# Patient Record
Sex: Female | Born: 1950 | Race: White | Hispanic: No | Marital: Married | Smoking: Never smoker
Health system: Southern US, Community
[De-identification: ages and names within clinical notes are randomized; demographics above are authoritative.]

## PROBLEM LIST (undated history)

## (undated) DIAGNOSIS — K7460 Unspecified cirrhosis of liver: Secondary | ICD-10-CM

## (undated) DIAGNOSIS — K219 Gastro-esophageal reflux disease without esophagitis: Secondary | ICD-10-CM

## (undated) DIAGNOSIS — B1920 Unspecified viral hepatitis C without hepatic coma: Secondary | ICD-10-CM

## (undated) DIAGNOSIS — F319 Bipolar disorder, unspecified: Secondary | ICD-10-CM

## (undated) DIAGNOSIS — K2270 Barrett's esophagus without dysplasia: Secondary | ICD-10-CM

## (undated) HISTORY — PX: OOPHORECTOMY: SHX86

## (undated) HISTORY — PX: ABDOMINAL HYSTERECTOMY: SHX81

## (undated) HISTORY — PX: CARPAL TUNNEL RELEASE: SHX101

## (undated) HISTORY — PX: ANKLE SURGERY: SHX546

---

## 1998-03-31 ENCOUNTER — Other Ambulatory Visit: Admission: RE | Admit: 1998-03-31 | Discharge: 1998-03-31 | Payer: Self-pay | Admitting: Orthopedic Surgery

## 1998-09-01 ENCOUNTER — Other Ambulatory Visit: Admission: RE | Admit: 1998-09-01 | Discharge: 1998-09-01 | Payer: Self-pay | Admitting: *Deleted

## 1999-10-27 ENCOUNTER — Other Ambulatory Visit: Admission: RE | Admit: 1999-10-27 | Discharge: 1999-10-27 | Payer: Self-pay | Admitting: *Deleted

## 2001-09-20 ENCOUNTER — Other Ambulatory Visit: Admission: RE | Admit: 2001-09-20 | Discharge: 2001-09-20 | Payer: Self-pay | Admitting: *Deleted

## 2002-11-26 ENCOUNTER — Other Ambulatory Visit: Admission: RE | Admit: 2002-11-26 | Discharge: 2002-11-26 | Payer: Self-pay | Admitting: *Deleted

## 2009-05-31 ENCOUNTER — Emergency Department: Payer: Self-pay | Admitting: Emergency Medicine

## 2011-03-16 ENCOUNTER — Emergency Department: Payer: Self-pay | Admitting: Internal Medicine

## 2014-12-19 ENCOUNTER — Inpatient Hospital Stay
Admission: EM | Admit: 2014-12-19 | Discharge: 2014-12-23 | DRG: 897 | Disposition: A | Discharge status: Home / Self Care | Payer: BLUE CROSS/BLUE SHIELD | Attending: Internal Medicine | Admitting: Internal Medicine

## 2014-12-19 ENCOUNTER — Emergency Department: Payer: BLUE CROSS/BLUE SHIELD

## 2014-12-19 DIAGNOSIS — B1920 Unspecified viral hepatitis C without hepatic coma: Secondary | ICD-10-CM | POA: Diagnosis present

## 2014-12-19 DIAGNOSIS — K921 Melena: Secondary | ICD-10-CM | POA: Diagnosis present

## 2014-12-19 DIAGNOSIS — K219 Gastro-esophageal reflux disease without esophagitis: Secondary | ICD-10-CM | POA: Diagnosis present

## 2014-12-19 DIAGNOSIS — K7290 Hepatic failure, unspecified without coma: Secondary | ICD-10-CM | POA: Diagnosis not present

## 2014-12-19 DIAGNOSIS — K7460 Unspecified cirrhosis of liver: Secondary | ICD-10-CM | POA: Diagnosis present

## 2014-12-19 DIAGNOSIS — W19XXXA Unspecified fall, initial encounter: Secondary | ICD-10-CM | POA: Diagnosis present

## 2014-12-19 DIAGNOSIS — E876 Hypokalemia: Secondary | ICD-10-CM | POA: Diagnosis present

## 2014-12-19 DIAGNOSIS — F319 Bipolar disorder, unspecified: Secondary | ICD-10-CM | POA: Diagnosis present

## 2014-12-19 DIAGNOSIS — F10239 Alcohol dependence with withdrawal, unspecified: Secondary | ICD-10-CM | POA: Diagnosis present

## 2014-12-19 DIAGNOSIS — R4781 Slurred speech: Secondary | ICD-10-CM | POA: Diagnosis not present

## 2014-12-19 DIAGNOSIS — Z79891 Long term (current) use of opiate analgesic: Secondary | ICD-10-CM

## 2014-12-19 DIAGNOSIS — K2970 Gastritis, unspecified, without bleeding: Secondary | ICD-10-CM | POA: Diagnosis present

## 2014-12-19 DIAGNOSIS — Z66 Do not resuscitate: Secondary | ICD-10-CM | POA: Diagnosis present

## 2014-12-19 DIAGNOSIS — I8500 Esophageal varices without bleeding: Secondary | ICD-10-CM | POA: Diagnosis present

## 2014-12-19 DIAGNOSIS — K922 Gastrointestinal hemorrhage, unspecified: Secondary | ICD-10-CM | POA: Diagnosis present

## 2014-12-19 DIAGNOSIS — D689 Coagulation defect, unspecified: Secondary | ICD-10-CM | POA: Diagnosis present

## 2014-12-19 DIAGNOSIS — F10230 Alcohol dependence with withdrawal, uncomplicated: Principal | ICD-10-CM | POA: Diagnosis present

## 2014-12-19 DIAGNOSIS — X58XXXA Exposure to other specified factors, initial encounter: Secondary | ICD-10-CM | POA: Diagnosis present

## 2014-12-19 DIAGNOSIS — Z79899 Other long term (current) drug therapy: Secondary | ICD-10-CM

## 2014-12-19 DIAGNOSIS — K2270 Barrett's esophagus without dysplasia: Secondary | ICD-10-CM | POA: Diagnosis present

## 2014-12-19 DIAGNOSIS — R000 Tachycardia, unspecified: Secondary | ICD-10-CM | POA: Diagnosis present

## 2014-12-19 DIAGNOSIS — F10939 Alcohol use, unspecified with withdrawal, unspecified: Secondary | ICD-10-CM | POA: Diagnosis present

## 2014-12-19 DIAGNOSIS — D6959 Other secondary thrombocytopenia: Secondary | ICD-10-CM | POA: Diagnosis present

## 2014-12-19 DIAGNOSIS — R100 Acute abdomen: Secondary | ICD-10-CM | POA: Diagnosis not present

## 2014-12-19 DIAGNOSIS — R109 Unspecified abdominal pain: Secondary | ICD-10-CM

## 2014-12-19 DIAGNOSIS — F10930 Alcohol use, unspecified with withdrawal, uncomplicated: Secondary | ICD-10-CM

## 2014-12-19 HISTORY — DX: Gastro-esophageal reflux disease without esophagitis: K21.9

## 2014-12-19 HISTORY — DX: Bipolar disorder, unspecified: F31.9

## 2014-12-19 HISTORY — DX: Barrett's esophagus without dysplasia: K22.70

## 2014-12-19 HISTORY — DX: Unspecified cirrhosis of liver: K74.60

## 2014-12-19 HISTORY — DX: Unspecified viral hepatitis C without hepatic coma: B19.20

## 2014-12-19 LAB — COMPREHENSIVE METABOLIC PANEL
ALK PHOS: 89 U/L (ref 38–126)
ALT: 152 U/L — AB (ref 14–54)
ANION GAP: 11 (ref 5–15)
AST: 596 U/L — ABNORMAL HIGH (ref 15–41)
Albumin: 3.9 g/dL (ref 3.5–5.0)
BUN: 10 mg/dL (ref 6–20)
CHLORIDE: 101 mmol/L (ref 101–111)
CO2: 23 mmol/L (ref 22–32)
Calcium: 8.7 mg/dL — ABNORMAL LOW (ref 8.9–10.3)
Creatinine, Ser: 0.58 mg/dL (ref 0.44–1.00)
GFR calc non Af Amer: >60 mL/min (ref >60)
Glucose, Bld: 125 mg/dL — ABNORMAL HIGH (ref 65–99)
Potassium: 4.2 mmol/L (ref 3.5–5.1)
Sodium: 135 mmol/L (ref 135–145)
TOTAL PROTEIN: 7.8 g/dL (ref 6.5–8.1)
Total Bilirubin: 2.8 mg/dL — ABNORMAL HIGH (ref 0.3–1.2)

## 2014-12-19 LAB — URINE DRUG SCREEN, QUALITATIVE (ARMC ONLY)
Amphetamines, Ur Screen: NOT DETECTED
Barbiturates, Ur Screen: NOT DETECTED
Benzodiazepine, Ur Scrn: NOT DETECTED
COCAINE METABOLITE, UR ~~LOC~~: NOT DETECTED
Cannabinoid 50 Ng, Ur ~~LOC~~: NOT DETECTED
MDMA (Ecstasy)Ur Screen: NOT DETECTED
METHADONE SCREEN, URINE: NOT DETECTED
Opiate, Ur Screen: POSITIVE — AB
Phencyclidine (PCP) Ur S: NOT DETECTED
Tricyclic, Ur Screen: NOT DETECTED

## 2014-12-19 LAB — PROTIME-INR
INR: 1.36
Prothrombin Time: 17 seconds — ABNORMAL HIGH (ref 11.4–15.0)

## 2014-12-19 LAB — URINALYSIS COMPLETE WITH MICROSCOPIC (ARMC ONLY)
BILIRUBIN URINE: NEGATIVE
Glucose, UA: NEGATIVE mg/dL
Leukocytes, UA: NEGATIVE
Nitrite: NEGATIVE
PH: 6 (ref 5.0–8.0)
Protein, ur: NEGATIVE mg/dL
Specific Gravity, Urine: 1.031 — ABNORMAL HIGH (ref 1.005–1.030)

## 2014-12-19 LAB — CBC
HCT: 46.5 % (ref 35.0–47.0)
Hemoglobin: 15.9 g/dL (ref 12.0–16.0)
MCH: 36.6 pg — AB (ref 26.0–34.0)
MCHC: 34.1 g/dL (ref 32.0–36.0)
MCV: 107.3 fL — ABNORMAL HIGH (ref 80.0–100.0)
Platelets: 75 10*3/uL — ABNORMAL LOW (ref 150–440)
RBC: 4.33 MIL/uL (ref 3.80–5.20)
RDW: 13.4 % (ref 11.5–14.5)
WBC: 7.3 10*3/uL (ref 3.6–11.0)

## 2014-12-19 LAB — MAGNESIUM: Magnesium: 1.7 mg/dL (ref 1.7–2.4)

## 2014-12-19 LAB — LIPASE, BLOOD: LIPASE: 53 U/L — AB (ref 22–51)

## 2014-12-19 LAB — AMYLASE: Amylase: 52 U/L (ref 28–100)

## 2014-12-19 LAB — ETHANOL: ALCOHOL ETHYL (B): 25 mg/dL — AB (ref <5)

## 2014-12-19 LAB — MRSA PCR SCREENING: MRSA by PCR: NEGATIVE

## 2014-12-19 LAB — HEMOGLOBIN: HEMOGLOBIN: 14.7 g/dL (ref 12.0–16.0)

## 2014-12-19 MED ORDER — THIAMINE HCL 100 MG/ML IJ SOLN
INTRAVENOUS | Status: DC
  Administered 2014-12-19 – 2014-12-22 (×4): via INTRAVENOUS
  Filled 2014-12-19 (×7): qty 1000

## 2014-12-19 MED ORDER — PERPHENAZINE 2 MG PO TABS
2.0000 mg | ORAL_TABLET | Freq: Every day | ORAL | Status: DC
  Administered 2014-12-19 – 2014-12-22 (×4): 2 mg via ORAL
  Filled 2014-12-19 (×3): qty 1

## 2014-12-19 MED ORDER — LORAZEPAM 2 MG PO TABS: 0.0000 mg | ORAL_TABLET | Freq: Two times a day (BID) | ORAL | Status: DC

## 2014-12-19 MED ORDER — LORAZEPAM 2 MG PO TABS
0.0000 mg | ORAL_TABLET | Freq: Two times a day (BID) | ORAL | Status: DC
Start: 2014-12-21 — End: 2014-12-19

## 2014-12-19 MED ORDER — LORAZEPAM 2 MG/ML IJ SOLN
0.0000 mg | Freq: Four times a day (QID) | INTRAMUSCULAR | Status: DC
  Administered 2014-12-19: 2 mg via INTRAVENOUS
  Filled 2014-12-19: qty 2

## 2014-12-19 MED ORDER — LORAZEPAM 2 MG PO TABS: 0.0000 mg | ORAL_TABLET | Freq: Four times a day (QID) | ORAL | Status: DC

## 2014-12-19 MED ORDER — LORAZEPAM 1 MG PO TABS
1.0000 mg | ORAL_TABLET | Freq: Four times a day (QID) | ORAL | Status: AC | PRN
  Administered 2014-12-21: 1 mg via ORAL
  Filled 2014-12-19: qty 1

## 2014-12-19 MED ORDER — IOHEXOL 300 MG/ML  SOLN
100.0000 mL | Freq: Once | INTRAMUSCULAR | Status: AC | PRN
  Administered 2014-12-19: 100 mL via INTRAVENOUS

## 2014-12-19 MED ORDER — ONDANSETRON HCL 4 MG/2ML IJ SOLN
INTRAMUSCULAR | Status: AC
  Administered 2014-12-19: 4 mg via INTRAVENOUS
  Filled 2014-12-19: qty 2

## 2014-12-19 MED ORDER — PERPHENAZINE 2 MG PO TABS
2.0000 mg | ORAL_TABLET | Freq: Every day | ORAL | Status: DC
  Filled 2014-12-19 (×2): qty 2

## 2014-12-19 MED ORDER — SODIUM CHLORIDE 0.9 % IV SOLN
INTRAVENOUS | Status: DC
  Administered 2014-12-19 – 2014-12-20 (×3): via INTRAVENOUS

## 2014-12-19 MED ORDER — ALBUTEROL SULFATE (2.5 MG/3ML) 0.083% IN NEBU: 2.5000 mg | INHALATION_SOLUTION | RESPIRATORY_TRACT | Status: DC | PRN

## 2014-12-19 MED ORDER — HYDROCODONE-ACETAMINOPHEN 7.5-325 MG PO TABS
1.0000 | ORAL_TABLET | Freq: Four times a day (QID) | ORAL | Status: DC | PRN
  Administered 2014-12-19 – 2014-12-21 (×5): 1 via ORAL
  Filled 2014-12-19 (×6): qty 1

## 2014-12-19 MED ORDER — LORAZEPAM 2 MG/ML IJ SOLN
0.0000 mg | Freq: Two times a day (BID) | INTRAMUSCULAR | Status: DC
  Administered 2014-12-21 – 2014-12-22 (×2): 2 mg via INTRAVENOUS
  Filled 2014-12-19 (×2): qty 1

## 2014-12-19 MED ORDER — LORAZEPAM 2 MG/ML IJ SOLN
0.0000 mg | Freq: Four times a day (QID) | INTRAMUSCULAR | Status: AC
  Administered 2014-12-19: 2 mg via INTRAVENOUS
  Administered 2014-12-19 – 2014-12-20 (×3): 4 mg via INTRAVENOUS
  Administered 2014-12-20 – 2014-12-21 (×3): 2 mg via INTRAVENOUS
  Administered 2014-12-21: 4 mg via INTRAVENOUS
  Filled 2014-12-19 (×2): qty 1
  Filled 2014-12-19 (×2): qty 2
  Filled 2014-12-19: qty 1
  Filled 2014-12-19 (×3): qty 2

## 2014-12-19 MED ORDER — SODIUM CHLORIDE 0.9 % IV BOLUS (SEPSIS)
1000.0000 mL | Freq: Once | INTRAVENOUS | Status: AC
  Administered 2014-12-19: 1000 mL via INTRAVENOUS

## 2014-12-19 MED ORDER — ONDANSETRON HCL 4 MG/2ML IJ SOLN
4.0000 mg | Freq: Once | INTRAMUSCULAR | Status: AC
  Administered 2014-12-19: 4 mg via INTRAVENOUS

## 2014-12-19 MED ORDER — LORAZEPAM 2 MG/ML IJ SOLN: 0.0000 mg | Freq: Two times a day (BID) | INTRAMUSCULAR | Status: DC

## 2014-12-19 MED ORDER — PANTOPRAZOLE SODIUM 40 MG IV SOLR
40.0000 mg | Freq: Two times a day (BID) | INTRAVENOUS | Status: DC
  Administered 2014-12-19 – 2014-12-23 (×9): 40 mg via INTRAVENOUS
  Filled 2014-12-19 (×9): qty 40

## 2014-12-19 MED ORDER — CETYLPYRIDINIUM CHLORIDE 0.05 % MT LIQD
7.0000 mL | Freq: Two times a day (BID) | OROMUCOSAL | Status: DC
  Administered 2014-12-19 – 2014-12-23 (×6): 7 mL via OROMUCOSAL

## 2014-12-19 MED ORDER — MORPHINE SULFATE 4 MG/ML IJ SOLN
4.0000 mg | Freq: Once | INTRAMUSCULAR | Status: AC
  Administered 2014-12-19: 4 mg via INTRAVENOUS

## 2014-12-19 MED ORDER — IOHEXOL 240 MG/ML SOLN
25.0000 mL | Freq: Once | INTRAMUSCULAR | Status: AC | PRN
  Administered 2014-12-19: 50 mL via ORAL

## 2014-12-19 MED ORDER — LORAZEPAM 2 MG/ML IJ SOLN
1.0000 mg | Freq: Four times a day (QID) | INTRAMUSCULAR | Status: AC | PRN
  Administered 2014-12-20 – 2014-12-21 (×2): 1 mg via INTRAVENOUS
  Filled 2014-12-19 (×3): qty 1

## 2014-12-19 MED ORDER — ACETAMINOPHEN 325 MG PO TABS: 650.0000 mg | ORAL_TABLET | Freq: Four times a day (QID) | ORAL | Status: DC | PRN

## 2014-12-19 MED ORDER — ONDANSETRON HCL 4 MG/2ML IJ SOLN: 4.0000 mg | Freq: Four times a day (QID) | INTRAMUSCULAR | Status: DC | PRN

## 2014-12-19 MED ORDER — ACETAMINOPHEN 650 MG RE SUPP: 650.0000 mg | Freq: Four times a day (QID) | RECTAL | Status: DC | PRN

## 2014-12-19 MED ORDER — MIRTAZAPINE 15 MG PO TABS
30.0000 mg | ORAL_TABLET | Freq: Every day | ORAL | Status: DC
  Administered 2014-12-19 – 2014-12-22 (×4): 30 mg via ORAL
  Filled 2014-12-19 (×4): qty 2

## 2014-12-19 MED ORDER — MORPHINE SULFATE 4 MG/ML IJ SOLN
INTRAMUSCULAR | Status: AC
  Administered 2014-12-19: 4 mg via INTRAVENOUS
  Filled 2014-12-19: qty 1

## 2014-12-19 MED ORDER — LORAZEPAM 2 MG PO TABS
0.0000 mg | ORAL_TABLET | Freq: Four times a day (QID) | ORAL | Status: DC
Start: 2014-12-19 — End: 2014-12-19

## 2014-12-19 MED ORDER — SODIUM CHLORIDE 0.9 % IV SOLN
Freq: Once | INTRAVENOUS | Status: AC
  Administered 2014-12-19: 10:00:00 via INTRAVENOUS

## 2014-12-19 MED ORDER — ONDANSETRON HCL 4 MG PO TABS: 4.0000 mg | ORAL_TABLET | Freq: Four times a day (QID) | ORAL | Status: DC | PRN

## 2014-12-19 NOTE — ED Notes (Signed)
Pt states "I am about to burn up", room air conditioned lowered, pt uncovered, pt shaking

## 2014-12-19 NOTE — ED Notes (Signed)
Pt returned from CT, resting in bed quietly in no distress 

## 2014-12-19 NOTE — ED Provider Notes (Signed)
[facility] Emergency Department Provider Note ____________________________________________  Time seen: Approximately 9:00 AM  I have reviewed the triage vital signs and the nursing notes.   HISTORY  Chief Complaint Fall; Tremors; and Melena  HPI Ariel Mayer is a 64 y.o. female who states that she fell a couple of days ago and has some abrasions to her arms and legs but this morning patient states that she has been having tremors, persistent abdominal pain since yesterday, and she states that she's been having diarrhea and now is passing blood per rectum. Patient states that her stools were dark and most of her abdominal pain is in her upper abdomen. On arrival to the ER patient was extremely tremulous and time and somewhat tachycardic. Patient with a very vague historian and stated that she was down here visiting her son and daughter-in-law. Patient states that she arrived yesterday and just started having this complications. Patient states that she drinks 2-3 glasses of wine a week but does not consider herself an alcoholic. Patient denies any fever, chills, cough or cold symptoms, dysuria, frequency, or hematuria. Patient denies any dizziness but states that she feels very weak. External pain on scale of 0-10 is about a 5-6, and she still extremely nauseated.   Past Medical History  Diagnosis Date  . Bipolar 1 disorder     Patient Active Problem List   Diagnosis Date Noted  . GIB (gastrointestinal bleeding) 12/19/2014    Past Surgical History  Procedure Laterality Date  . Abdominal hysterectomy      Current Outpatient Rx  Name  Route  Sig  Dispense  Refill  . HYDROcodone-acetaminophen (NORCO) 7.5-325 MG per tablet   Oral   Take 1 tablet by mouth every 6 (six) hours as needed for moderate pain.          . mirtazapine (REMERON SOL-TAB) 30 MG disintegrating tablet   Oral   Take 1 tablet by mouth at bedtime.         Marland Kitchen perphenazine (TRILAFON) 2  MG tablet   Oral   Take 1-2 tablets by mouth at bedtime.           Allergies Review of patient's allergies indicates no known allergies.  No family history on file.  Social History History  Substance Use Topics  . Smoking status: Never Smoker   . Smokeless tobacco: Not on file  . Alcohol Use: Yes    Review of Systems Constitutional: No fever/chills Eyes: No visual changes. ENT: No sore throat. Cardiovascular: Denies chest pain. Respiratory: Denies shortness of breath. Gastrointestinal: Positive for abdominal pain, nausea, and no blood from her rectum..   No diarrhea.  No constipation. Genitourinary: Negative for dysuria. Musculoskeletal: Negative for back pain. Skin: Negative for rash. Neurological: Negative for headaches, focal weakness or numbness. Patient feels extremely tremulous and weak all over 10-point ROS otherwise negative.  ____________________________________________   PHYSICAL EXAM:  VITAL SIGNS: ED Triage Vitals  Enc Vitals Group     BP 12/19/14 0927 153/84 mmHg     Pulse Rate 12/19/14 0927 110     Resp 12/19/14 0927 16     Temp 12/19/14 0927 98.5 F (36.9 C)     Temp Source 12/19/14 0927 Oral     SpO2 12/19/14 0927 95 %     Weight 12/19/14 0927 150 lb (68.04 kg)     Height 12/19/14 0927 5\' 11"  (1.803 m)     Head Cir --      Peak Flow --  Pain Score 12/19/14 0927 9     Pain Loc --      Pain Edu? --      Excl. in GC? --     Constitutional: Alert and oriented. Well appearing and in mild distress secondary to her pain, weakness, and tremors. Eyes: Conjunctivae are normal. PERRL. EOMI. Head: Atraumatic. Nose: No congestion/rhinnorhea. Mouth/Throat: Mucous membranes are moist.  Oropharynx non-erythematous. Neck: No stridor.   Cardiovascular: Patient is tachycardic, regular rhythm. Patient with 2/6 systolic ejection murmur.  Good peripheral circulation. Respiratory: Normal respiratory effort.  No retractions. Lungs CTAB. Gastrointestinal:  Soft and tender to palpation primarily in her diffuse upper abdomen. No distention. No abdominal bruits. No CVA tenderness. Rectal exam shows her stools to be dark and grossly heme positive. Musculoskeletal: No lower extremity tenderness nor edema.  No joint effusions. Patient has multiple abrasions to her left shoulder left posterior elbow bilateral knees and right hand. Patient does not have any obvious bony injuries and has full range of motion of all. Neurologic:  Normal speech and language. No gross focal neurologic deficits are appreciated. No gait instability. Skin:  Skin is warm, dry and intact. No rash noted. Psychiatric: Mood and affect are flat. Speech and behavior are normal.  ____________________________________________   LABS (all labs ordered are listed, but only abnormal results are displayed)  Labs Reviewed  COMPREHENSIVE METABOLIC PANEL - Abnormal; Notable for the following:    Glucose, Bld 125 (*)    Calcium 8.7 (*)    AST 596 (*)    ALT 152 (*)    Total Bilirubin 2.8 (*)    All other components within normal limits  CBC - Abnormal; Notable for the following:    MCV 107.3 (*)    MCH 36.6 (*)    Platelets 75 (*)    All other components within normal limits  LIPASE, BLOOD - Abnormal; Notable for the following:    Lipase 53 (*)    All other components within normal limits  PROTIME-INR - Abnormal; Notable for the following:    Prothrombin Time 17.0 (*)    All other components within normal limits  ETHANOL - Abnormal; Notable for the following:    Alcohol, Ethyl (B) 25 (*)    All other components within normal limits  AMYLASE  URINALYSIS COMPLETEWITH MICROSCOPIC ([facility] ONLY)  URINE DRUG SCREEN, QUALITATIVE ([facility] ONLY)   ____________________________________________  EKG ED ECG REPORT I, Leona Carry, the attending physician, personally viewed and interpreted this ECG.   Date: 12/19/2014  EKG Time: 9:35 AM  Rate: 102  Rhythm: normal EKG, normal sinus  rhythm, unchanged from previous tracings, sinus tachycardia  Axis: Normal  Intervals:none  ST&T Change: None  ____________________________________________  RADIOLOGY Ct Abdomen Pelvis W Contrast  12/19/2014   CLINICAL DATA:  Left-sided abdominal pain after falling last Sunday. Tremors with dark stools. History of hysterectomy and appendectomy. Initial encounter.  EXAM: CT ABDOMEN AND PELVIS WITH CONTRAST  TECHNIQUE: Multidetector CT imaging of the abdomen and pelvis was performed using the standard protocol following bolus administration of intravenous contrast.  CONTRAST:  OMNIPAQUE IOHEXOL 300 MG/ML  SOLN  COMPARISON:  CT 03/16/2011.  FINDINGS: Lower chest: Clear lung bases. No significant pleural or pericardial effusion. The heart is mildly enlarged. There is atherosclerosis of the aorta and coronary arteries. Distal esophageal varices noted.  Hepatobiliary: The liver demonstrates severe steatosis with contour irregularity and diffuse micro nodularity consistent with cirrhosis. No focal enhancing lesions identified. The portal and hepatic veins  are patent. The left paraumbilical vein is recanalized. The gallbladder is distended without calcified stones, significant wall thickening or biliary dilatation.  Pancreas: Unremarkable. No pancreatic ductal dilatation or surrounding inflammatory changes.  Spleen: The spleen is not significantly enlarged. There is a stable cyst inferiorly within the spleen.  Adrenals/Urinary Tract: Both adrenal glands appear normal.The kidneys appear normal without evidence of urinary tract calculus, suspicious lesion or hydronephrosis. No bladder abnormalities are seen.  Stomach/Bowel: The stomach and small bowel demonstrate no significant findings. There is prominent wall thickening within the right colon. No significant surrounding inflammation identified. Appendix non visualized. The distal colon demonstrates mild diverticulosis without wall thickening.   Vascular/Lymphatic: Stable mild reactive adenopathy in the porta hepatis. No retroperitoneal lymphadenopathy. Stable atherosclerosis of the aorta, its branches and iliac arteries. As above, there is re- cannulization of the left periumbilical vein and distal esophageal varices.  Reproductive: Status post hysterectomy.  No adnexal mass.  Other: Mild mesenteric edema without ascites.  Musculoskeletal: No acute or significant osseous findings. Stable lumbar spine facet degenerative changes.  IMPRESSION: 1. Cirrhosis with increased steatosis, but no apparent focal hepatic lesion. 2. Portal hypertension manifesting as distal esophageal varices and re- cannulization of the left paraumbilical vein. No significant splenomegaly. 3. Right colon wall thickening could be related to the patient's liver disease, although is suspicious for colitis. No evidence of bowel obstruction or abscess. 4. Moderate diffuse atherosclerosis. 5. Gallbladder hydrops without wall thickening or biliary dilatation.   Electronically Signed   By: Carey Bullocks M.D.   On: 12/19/2014 12:54    ____________________________________________   PROCEDURES  Procedure(s) performed: None  Critical Care performed: No  ____________________________________________   INITIAL IMPRESSION / ASSESSMENT AND PLAN / ED COURSE  Pertinent labs & imaging results that were available during my care of the patient were reviewed by me and considered in my medical decision making (see chart for details). ----------------------------------------- 10:45 AM on 12/19/2014 -----------------------------------------  Patient will be started on IV fluids, IV Protonix, and given some pain and nausea medicine at this time. Patient will get labs and CT abdomen and pelvis. ____________________________________________  ----------------------------------------- 1:55 PM on 12/19/2014 -----------------------------------------  Patient's CT scan showed cirrhosis of  the liver with portal hypertension and esophageal varices. Patient's hemoglobin is still normal at this time. Patient's alcohol also was mildly elevated which shows that the patient's tremors are probably from withdrawal. Patient is going to be admitted per Dr. Amado Coe the hospitalist. Patient is feeling some better after IV fluids pain and nausea medicines.  FINAL CLINICAL IMPRESSION(S) / ED DIAGNOSES  Final diagnoses:  Acute abdominal pain  Gastrointestinal hemorrhage, unspecified gastritis, unspecified gastrointestinal hemorrhage type  Esophageal varices  Alcohol withdrawal, uncomplicated      Leona Carry, MD 12/19/14 1358

## 2014-12-19 NOTE — ED Notes (Signed)
Ct notified 2 times that pt is done drinking contrast

## 2014-12-19 NOTE — ED Notes (Addendum)
Pt arrives with complaints of tremors and dark stools, pt states she slipped on a rug and fell on Sunday and has been progressively getting worse, pt diaphoretic and shaking, pt has skin tears to left elbow and abrasions to left knee, pt states she has not been able to eat or drink since the fall, pt skin yellow/orange tint, redness to eyes

## 2014-12-19 NOTE — ED Notes (Signed)
Patient transported to CT 

## 2014-12-19 NOTE — ED Notes (Signed)
Pt states she drinks 2-3 glasses of wine every other night but has not drank any since Tuesday

## 2014-12-19 NOTE — ED Notes (Signed)
Report given to ICU, waiting to take pt up due to pt transition in ICU, awaiting call back

## 2014-12-19 NOTE — ED Notes (Signed)
Pt resting in bed quietly, pt no longer displaying tremors or shaking

## 2014-12-19 NOTE — Progress Notes (Signed)
Patient admitted from ED to ICU stepdown for GI bleed and  ETOH Withdrawal.  Alert and oriented, vital signs stable, sinus rhythm on cardiac monitor.  Patient currently receiving NS at 53ml/hr with MVI at 59ml/hr infusing without difficulty.  Scheduled and PRN ativan for CIWA.  NPO per Dr Imogene Burn.   GI consulted.  Patient currently resting in no apparent distress.  See [facility] for further details.

## 2014-12-19 NOTE — ED Notes (Signed)
Pt states that she fell on wooden deck last Sunday, injuring left side of body. Beginning Tuesday she began to have tremors and black stools. Pt denies being on blood thinners. Pt skin and eyes reddened, pt shaking uncontrollable. Tearful.

## 2014-12-19 NOTE — H&P (Signed)
[facility] [place] [facility]   PATIENT NAME: Ariel Mayer    MR#:  [id number]  DATE OF BIRTH:  03/19/1951  DATE OF ADMISSION:  12/19/2014  PRIMARY CARE PHYSICIAN: No primary care provider on file.   REQUESTING/REFERRING PHYSICIAN: Leona Carry, MD  CHIEF COMPLAINT:   Chief Complaint  Patient presents with  . Fall  . Tremors  . Melena   melena since yesterday and tremor 2 days.  HISTORY OF PRESENT ILLNESS:  Ariel Mayer  is a 64 y.o. female with a known history of hepatitis C, liver cirrhosis, GERD, Barrett's esophagus, alcohol abuse. She is stopped drinking alcohol 2 days ago and started to have  tremor and shaking. In addition, she noticed that she has melena since yesterday. She has a history of hepatitis C and liver cirrhosis and remote history of GI bleeding. She got endoscopy for GI bleeding 2 years ago. On arrival to the ER patient was extremely tremulous and time and somewhat tachycardic. She complains of nausea but no vomiting or diarrhea. She also complains of muscle pain.  PAST MEDICAL HISTORY:   Past Medical History  Diagnosis Date  . Bipolar 1 disorder    hepatitis C, liver cirrhosis, Barrett's esophagus, alcohol abuse. PAST SURGICAL HISTORY:   Past Surgical History  Procedure Laterality Date  . Abdominal hysterectomy     Oophorectomy, hysterectomy, carpal tunnel release, total ankle replacement SOCIAL HISTORY:   History  Substance Use Topics  . Smoking status: Never Smoker   . Smokeless tobacco: Not on file  . Alcohol Use: Yes   Drink wine 2-3 glasses every other day for more than 10 years, denies any smoking or drug abuse.  FAMILY HISTORY:  No family history on file.Colon cancer in maternal grandfather, heart disease and hypertension in her father, breast cancer in her mother.  DRUG ALLERGIES:  No Known Allergies  REVIEW OF SYSTEMS:  CONSTITUTIONAL: No fever,but has generalized  weakness.  EYES: No blurred or  double vision.  EARS, NOSE, AND THROAT: No tinnitus or ear pain.  RESPIRATORY: No cough, shortness of breath, wheezing or hemoptysis.  CARDIOVASCULAR: No chest pain, orthopnea, edema.  GASTROINTESTINAL:Has  nausea,  no vomiting, diarrhea or abdominal pain. No bloody stool But has melena. GENITOURINARY: No dysuria, hematuria.  ENDOCRINE: No polyuria, nocturia,  HEMATOLOGY: No anemia, easy bruising or bleeding SKIN: No rash or lesion. MUSCULOSKELETAL: No joint pain or arthritis.  Muscle cramps.  NEUROLOGIC: No tingling, numbness, weakness.Tremor and shaking.  PSYCHIATRY: No anxiety or depression.   MEDICATIONS AT HOME:   Prior to Admission medications   Medication Sig Start Date End Date Taking? Authorizing Provider  HYDROcodone-acetaminophen (NORCO) 7.5-325 MG per tablet Take 1 tablet by mouth every 6 (six) hours as needed for moderate pain.    Yes Historical Provider, MD  mirtazapine (REMERON SOL-TAB) 30 MG disintegrating tablet Take 1 tablet by mouth at bedtime.   Yes Historical Provider, MD  perphenazine (TRILAFON) 2 MG tablet Take 1-2 tablets by mouth at bedtime. 11/29/14  Yes Historical Provider, MD      VITAL SIGNS:  Blood pressure 155/89, pulse 104, temperature 98.5 F (36.9 C), temperature source Oral, resp. rate 18, height 5\' 11"  (1.803 m), weight 68.04 kg (150 lb), SpO2 95 %.  PHYSICAL EXAMINATION:  GENERAL:  64 y.o.-year-old patient lying in the bed with no acute distress.  EYES: Pupils equal, round, reactive to light and accommodation. No scleral icterus. Extraocular muscles intact.  HEENT: Head atraumatic, normocephalic. Oropharynx and  nasopharynx clear.  NECK:  Supple, no jugular venous distention. No thyroid enlargement, no tenderness.  LUNGS: Normal breath sounds bilaterally, no wheezing, rales,rhonchi or crepitation. No use of accessory muscles of respiration.  CARDIOVASCULAR: S1, S2 normal. No murmurs, rubs, or gallops.  ABDOMEN: Soft, nontender, nondistended. Bowel  sounds present. No organomegaly or mass.  EXTREMITIES: No pedal edema, cyanosis, or clubbing.  NEUROLOGIC: Cranial nerves II through XII are intact. Muscle strength 5/5 in all extremities. Sensation intact. Gait not checked. The patient has a moderate to severe tremor and shaking.  PSYCHIATRIC: The patient is alert and oriented x 3.  SKIN: No obvious rash, lesion, or ulcer. Birthmark on the right side of the chest.  LABORATORY PANEL:   CBC  Recent Labs Lab 12/19/14 0942  WBC 7.3  HGB 15.9  HCT 46.5  PLT 75*   ------------------------------------------------------------------------------------------------------------------  Chemistries   Recent Labs Lab 12/19/14 0942  NA 135  K 4.2  CL 101  CO2 23  GLUCOSE 125*  BUN 10  CREATININE 0.58  CALCIUM 8.7*  AST 596*  ALT 152*  ALKPHOS 89  BILITOT 2.8*   ------------------------------------------------------------------------------------------------------------------  Cardiac Enzymes No results for input(s): TROPONINI in the last 168 hours. ------------------------------------------------------------------------------------------------------------------  RADIOLOGY:  Ct Abdomen Pelvis W Contrast  12/19/2014   CLINICAL DATA:  Left-sided abdominal pain after falling last Sunday. Tremors with dark stools. History of hysterectomy and appendectomy. Initial encounter.  EXAM: CT ABDOMEN AND PELVIS WITH CONTRAST  TECHNIQUE: Multidetector CT imaging of the abdomen and pelvis was performed using the standard protocol following bolus administration of intravenous contrast.  CONTRAST:  OMNIPAQUE IOHEXOL 300 MG/ML  SOLN  COMPARISON:  CT 03/16/2011.  FINDINGS: Lower chest: Clear lung bases. No significant pleural or pericardial effusion. The heart is mildly enlarged. There is atherosclerosis of the aorta and coronary arteries. Distal esophageal varices noted.  Hepatobiliary: The liver demonstrates severe steatosis with contour  irregularity and diffuse micro nodularity consistent with cirrhosis. No focal enhancing lesions identified. The portal and hepatic veins are patent. The left paraumbilical vein is recanalized. The gallbladder is distended without calcified stones, significant wall thickening or biliary dilatation.  Pancreas: Unremarkable. No pancreatic ductal dilatation or surrounding inflammatory changes.  Spleen: The spleen is not significantly enlarged. There is a stable cyst inferiorly within the spleen.  Adrenals/Urinary Tract: Both adrenal glands appear normal.The kidneys appear normal without evidence of urinary tract calculus, suspicious lesion or hydronephrosis. No bladder abnormalities are seen.  Stomach/Bowel: The stomach and small bowel demonstrate no significant findings. There is prominent wall thickening within the right colon. No significant surrounding inflammation identified. Appendix non visualized. The distal colon demonstrates mild diverticulosis without wall thickening.  Vascular/Lymphatic: Stable mild reactive adenopathy in the porta hepatis. No retroperitoneal lymphadenopathy. Stable atherosclerosis of the aorta, its branches and iliac arteries. As above, there is re- cannulization of the left periumbilical vein and distal esophageal varices.  Reproductive: Status post hysterectomy.  No adnexal mass.  Other: Mild mesenteric edema without ascites.  Musculoskeletal: No acute or significant osseous findings. Stable lumbar spine facet degenerative changes.  IMPRESSION: 1. Cirrhosis with increased steatosis, but no apparent focal hepatic lesion. 2. Portal hypertension manifesting as distal esophageal varices and re- cannulization of the left paraumbilical vein. No significant splenomegaly. 3. Right colon wall thickening could be related to the patient's liver disease, although is suspicious for colitis. No evidence of bowel obstruction or abscess. 4. Moderate diffuse atherosclerosis. 5. Gallbladder hydrops without  wall thickening or biliary dilatation.   Electronically  Signed   By: Carey Bullocks M.D.   On: 12/19/2014 12:54    EKG:   Orders placed or performed during the hospital encounter of 12/19/14  . ED EKG  . ED EKG    IMPRESSION AND PLAN:   GI bleeding Alcohol withdrawal Alcohol abuse Esophageal varices Liver cirrhosis Abnormal liver function test, possible due to liver cirrhosis and alcohol abuse. Thrombocytopenia due to liver cirrhosis  The patient will be admitted to stepdown unit. For alcohol withdrawal, I will start CIWA protocol, start banana bag IV and Ativan when necessary. For GI bleeding, I will keep patient on nothing by mouth status, start Protonix IV, follow-up hemoglobin every 6 hours and get a GI consult. Follow-up CMP.  All the records are reviewed and case discussed with ED provider. Management plans discussed with the patient, family and they are in agreement.  CODE STATUS: DO NOT RESUSCITATE  TOTAL CRITICAL TIME TAKING CARE OF THIS PATIENT: 62 minutes.    Shaune Pollack M.D on 12/19/2014 at 2:16 PM  Between 7am to 6pm - Pager - [phone]  After 6pm go to [email or web address] - password [email or web address]  [facility]  Office  [phone]  CC: Primary care physician; No primary care provider on file.

## 2014-12-20 LAB — BASIC METABOLIC PANEL
Anion gap: 8 (ref 5–15)
BUN: 10 mg/dL (ref 6–20)
CO2: 25 mmol/L (ref 22–32)
CREATININE: 0.45 mg/dL (ref 0.44–1.00)
Calcium: 8.2 mg/dL — ABNORMAL LOW (ref 8.9–10.3)
Chloride: 105 mmol/L (ref 101–111)
GFR calc Af Amer: >60 mL/min (ref >60)
GFR calc non Af Amer: >60 mL/min (ref >60)
Glucose, Bld: 76 mg/dL (ref 65–99)
Potassium: 3.7 mmol/L (ref 3.5–5.1)
SODIUM: 138 mmol/L (ref 135–145)

## 2014-12-20 LAB — CBC
HCT: 40.2 % (ref 35.0–47.0)
HEMOGLOBIN: 13.7 g/dL (ref 12.0–16.0)
MCH: 36.8 pg — ABNORMAL HIGH (ref 26.0–34.0)
MCHC: 34.1 g/dL (ref 32.0–36.0)
MCV: 107.8 fL — AB (ref 80.0–100.0)
PLATELETS: 49 10*3/uL — AB (ref 150–440)
RBC: 3.73 MIL/uL — ABNORMAL LOW (ref 3.80–5.20)
RDW: 13.2 % (ref 11.5–14.5)
WBC: 5.8 10*3/uL (ref 3.6–11.0)

## 2014-12-20 LAB — HEMOGLOBIN: HEMOGLOBIN: 13.5 g/dL (ref 12.0–16.0)

## 2014-12-20 MED ORDER — DIAZEPAM 2 MG PO TABS: 2.0000 mg | ORAL_TABLET | Freq: Four times a day (QID) | ORAL | Status: DC | PRN

## 2014-12-20 MED ORDER — DIAZEPAM 5 MG/ML IJ SOLN: 2.0000 mg | INTRAMUSCULAR | Status: DC | PRN

## 2014-12-20 NOTE — Consult Note (Signed)
Records reviewed from [facility] [place], [place]. She is  established with Dr. Ardelle Park in GI and last saw him in January 2016. Her latest  HCV viral load was NEGATIVE 06/27/2014. She had an EGD 01/31/2013 reported as positive Barrett's by appearance and no biopsies because of grade II varices. I saw this summary- but could not actually find the endo report. She is due for a repeat colonoscopy in 2017 for FH multiple members with colon cancer. I did not see the colon endo report. She has had imaging studies and labs with baseline rise in T bili 1.5, pro time 17, platelet range down into 70's, AFP 15 on 04/2013. She reported alcohol  abstinence for 1 year in Jan 2016.

## 2014-12-20 NOTE — Consult Note (Signed)
Consultation Referring Provider: Dr. Imogene Burn  Primary Care Physician:  No primary care provider on file. Consulting  Gastroenterologist:  Dr. Lynnae Prude       Reason for Consultation: GIB, Cirrhosis, HCV             HPI:   Ariel Mayer is a 64 y.o. female with a known history of hepatitis C, liver cirrhosis, GERD, Barrett's esophagus, alcohol abuse. She reports she came into the hospital because of a fall. She denies abdominal pain abut does report that her stools have been dark. ER records reviewed and she reported abdominal   pain, diarrhea and dark stool. DRE confirmed dark stool and grossly hem positive . Her admission Hgb was 15 and it is 13.7, low plts 49. Pro time 17, INR 1.36 today consistent with liver history. She has had no bowel movements or vomiting during  this admission. She is on CIWA precautions and pulled out her lines during the night. They were re started.  She is restless in bed and asking to go home. Vital stable.  She had an EGD in 2014 and tells me she has Barrett's. She denies hx of ulcer or UGI bleed- although she is a poor historian. She reports a colonoscopy  for FH colon cancer. Records will need to be reviewed from outside facilities as she is new here.      Past Medical History  Diagnosis Date  . Bipolar 1 disorder   . GERD (gastroesophageal reflux disease)   . Liver cirrhosis   . Barrett esophagus   . Hepatitis C     Past Surgical History  Procedure Laterality Date  . Abdominal hysterectomy    . Oophorectomy    . Carpal tunnel release    . Ankle surgery      Family History  Problem Relation Age of Onset  . Breast cancer Mother   . Heart disease Father   . Hypertension Father   . Colon cancer Other      History  Substance Use Topics  . Smoking status: Never Smoker   . Smokeless tobacco: Not on file  . Alcohol Use: 6.0 oz/week    10 Glasses of wine per week    Prior to Admission medications   Medication Sig Start Date End Date Taking?  Authorizing Provider  HYDROcodone-acetaminophen (NORCO) 7.5-325 MG per tablet Take 1 tablet by mouth every 6 (six) hours as needed for moderate pain.    Yes Historical Provider, MD  mirtazapine (REMERON SOL-TAB) 30 MG disintegrating tablet Take 1 tablet by mouth at bedtime.   Yes Historical Provider, MD  perphenazine (TRILAFON) 2 MG tablet Take 1-2 tablets by mouth at bedtime. 11/29/14  Yes Historical Provider, MD    Current Facility-Administered Medications  Medication Dose Route Frequency Provider Last Rate Last Dose  . 0.9 %  sodium chloride infusion   Intravenous Continuous Shaune Pollack, MD 75 mL/hr at 12/20/14 0535    . acetaminophen (TYLENOL) tablet 650 mg  650 mg Oral Q6H PRN Shaune Pollack, MD       Or  . acetaminophen (TYLENOL) suppository 650 mg  650 mg Rectal Q6H PRN Shaune Pollack, MD      . albuterol (PROVENTIL) (2.5 MG/3ML) 0.083% nebulizer solution 2.5 mg  2.5 mg Nebulization Q2H PRN Shaune Pollack, MD      . antiseptic oral rinse (CPC / CETYLPYRIDINIUM CHLORIDE 0.05%) solution 7 mL  7 mL Mouth Rinse BID Shaune Pollack, MD   7 mL at 12/19/14 2200  .  HYDROcodone-acetaminophen (NORCO) 7.5-325 MG per tablet 1 tablet  1 tablet Oral Q6H PRN Shaune Pollack, MD   1 tablet at 12/20/14 [phone]  . LORazepam (ATIVAN) injection 0-4 mg  0-4 mg Intravenous Q6H Shaune Pollack, MD   4 mg at 12/20/14 0530   Followed by  . [START ON 12/21/2014] LORazepam (ATIVAN) injection 0-4 mg  0-4 mg Intravenous Q12H Shaune Pollack, MD      . LORazepam (ATIVAN) tablet 1 mg  1 mg Oral Q6H PRN Shaune Pollack, MD       Or  . LORazepam (ATIVAN) injection 1 mg  1 mg Intravenous Q6H PRN Shaune Pollack, MD   1 mg at 12/20/14 0201  . mirtazapine (REMERON) tablet 30 mg  30 mg Oral QHS Shaune Pollack, MD   30 mg at 12/19/14 2110  . ondansetron (ZOFRAN) tablet 4 mg  4 mg Oral Q6H PRN Shaune Pollack, MD       Or  . ondansetron [facility]) injection 4 mg  4 mg Intravenous Q6H PRN Shaune Pollack, MD      . pantoprazole (PROTONIX) injection 40 mg  40 mg Intravenous Q12H Shaune Pollack, MD   40 mg  at 12/19/14 2110  . perphenazine (TRILAFON) tablet 2 mg  2 mg Oral QHS Shaune Pollack, MD   2 mg at 12/19/14 2110  . sodium chloride 0.9 % 1,000 mL with thiamine 100 mg, folic acid 1 mg, multivitamins adult 10 mL infusion   Intravenous Continuous Shaune Pollack, MD 40 mL/hr at 12/19/14 1729      Allergies as of 12/19/2014  . (No Known Allergies)     Review of Systems:    A 12 system review was obtained and all negative except where noted in HPI.    Physical Exam:  Vital signs in last 24 hours: Temp:  [98.2 F (36.8 C)-98.9 F (37.2 C)] 98.2 F (36.8 C) (07/22 0400) Pulse Rate:  [77-113] 79 (07/22 0700) Resp:  [9-21] 15 (07/22 0700) BP: (94-169)/(60-100) 118/72 mmHg (07/22 0700) SpO2:  [87 %-98 %] 96 % (07/22 0700) Weight:  [68.04 kg (150 lb)] 68.04 kg (150 lb) (07/21 0927) Last BM Date: 12/18/14  General:  Well-developed, chronically ill patient who is restless and at risk for DTs Head:  Head without obvious abnormality, atraumatic  Eyes:   Conjunctiva pink, sclera anicteric   ENT:   Mouth free of lesions, mucosa moist, tongue pink, no thrush noted, teeth and gums normal Neck:   Supple w/o thyromegaly or mass, trachea midline, no adenopathy  Lungs: Clear to auscultation bilaterally, respirations unlabored Heart:     Normal S1S2, no rubs, murmurs, gallops. Abdomen: Soft, non-tender, no hepatosplenomegaly, hernia, or mass and BS normal Rectal: Deferred Lymph:  No cervical or supraclavicular adenopathy. Extremities:   No edema, cyanosis, or clubbing, poor muscle tone Skin  Skin color sallow, multiple bruises, texture, turgor normal, no rashes or lesions Neuro:  Alert to name and some facts about her past EGD. She denies pain, wants to go home. She does not know where she is. She turns and follows simple commands. Tremors.  Psych:  See HPI. Drowsy. Restless.   Data Reviewed:  LAB RESULTS:  Recent Labs  12/19/14 0942  12/20/14 0028 12/20/14 0444  WBC 7.3  --   --  5.8  HGB 15.9   < > 13.5 13.7  HCT 46.5  --   --  40.2  PLT 75*  --   --  49*  < > = values in this  interval not displayed. BMET  Recent Labs  12/19/14 0942 12/20/14 0444  NA 135 138  K 4.2 3.7  CL 101 105  CO2 23 25  GLUCOSE 125* 76  BUN 10 10  CREATININE 0.58 0.45  CALCIUM 8.7* 8.2*   LFT  Recent Labs  12/19/14 0942  PROT 7.8  ALBUMIN 3.9  AST 596*  ALT 152*  ALKPHOS 89  BILITOT 2.8*   PT/INR  Recent Labs  12/19/14 0942  LABPROT 17.0*  INR 1.36    STUDIES: Ct Abdomen Pelvis W Contrast  12/19/2014   CLINICAL DATA:  Left-sided abdominal pain after falling last Sunday. Tremors with dark stools. History of hysterectomy and appendectomy. Initial encounter.  EXAM: CT ABDOMEN AND PELVIS WITH CONTRAST  TECHNIQUE: Multidetector CT imaging of the abdomen and pelvis was performed using the standard protocol following bolus administration of intravenous contrast.  CONTRAST:  OMNIPAQUE IOHEXOL 300 MG/ML  SOLN  COMPARISON:  CT 03/16/2011.  FINDINGS: Lower chest: Clear lung bases. No significant pleural or pericardial effusion. The heart is mildly enlarged. There is atherosclerosis of the aorta and coronary arteries. Distal esophageal varices noted.  Hepatobiliary: The liver demonstrates severe steatosis with contour irregularity and diffuse micro nodularity consistent with cirrhosis. No focal enhancing lesions identified. The portal and hepatic veins are patent. The left paraumbilical vein is recanalized. The gallbladder is distended without calcified stones, significant wall thickening or biliary dilatation.  Pancreas: Unremarkable. No pancreatic ductal dilatation or surrounding inflammatory changes.  Spleen: The spleen is not significantly enlarged. There is a stable cyst inferiorly within the spleen.  Adrenals/Urinary Tract: Both adrenal glands appear normal.The kidneys appear normal without evidence of urinary tract calculus, suspicious lesion or hydronephrosis. No bladder abnormalities are  seen.  Stomach/Bowel: The stomach and small bowel demonstrate no significant findings. There is prominent wall thickening within the right colon. No significant surrounding inflammation identified. Appendix non visualized. The distal colon demonstrates mild diverticulosis without wall thickening.  Vascular/Lymphatic: Stable mild reactive adenopathy in the porta hepatis. No retroperitoneal lymphadenopathy. Stable atherosclerosis of the aorta, its branches and iliac arteries. As above, there is re- cannulization of the left periumbilical vein and distal esophageal varices.  Reproductive: Status post hysterectomy.  No adnexal mass.  Other: Mild mesenteric edema without ascites.  Musculoskeletal: No acute or significant osseous findings. Stable lumbar spine facet degenerative changes.  IMPRESSION: 1. Cirrhosis with increased steatosis, but no apparent focal hepatic lesion. 2. Portal hypertension manifesting as distal esophageal varices and re- cannulization of the left paraumbilical vein. No significant splenomegaly. 3. Right colon wall thickening could be related to the patient's liver disease, although is suspicious for colitis. No evidence of bowel obstruction or abscess. 4. Moderate diffuse atherosclerosis. 5. Gallbladder hydrops without wall thickening or biliary dilatation.   Electronically Signed   By: Carey Bullocks M.D.   On: 12/19/2014 12:54     Assessment:  Darcey Cardy is a 64 y.o. presents with GI bleeding as shown by dark hem positive  stool and slight Hgb drift 15- 13.7. BUN is normal- therefore reassuring that this is liely not a significant UGI bleed. Vitals stable. She has not had vomiting or a stool during this admission. Low plts and elevated PT place her at significant risk for bleeding, however.  Cirrhosis with alcoholic withdrawal  and on CIWA precautions.Positive HCV history CT shows esophageal varices and last EGD reported 2014. Barrett  hx noted.  CT with right colon thickening-  patient without tenderness, pain, or diarrhea. No ascites.  Plan:  Consider EGD before discharge when she is out of DT risk.  If she develops active GI bleed, will need to consider a stat EGD study. For now, she appears stable.  Close monitoring of labs Continue IV Protonix. I will review records from outside facilities. She is a poor historian and family is not present.   This case was discussed with Dr. Scot Jun in collaboration of care. Thank you for the consultation.  These services provided by Amedeo Kinsman RN, MSN, ANP-BC under collaborative practice agreement with Scot Jun, MD.  12/20/2014, 8:05 AM

## 2014-12-20 NOTE — Progress Notes (Addendum)
Upon assessment to this floor patient c/o pain in extremities. Unable to give pain or anxiety meds at this time (too close to last dose), however patient quickly fell asleep. She is resting quietly with her son bedside. No acute distress at this time. Continue to monitor patient.

## 2014-12-20 NOTE — Progress Notes (Signed)
[facility] - [place] at [facility]                                                                                                                                                                                            Patient Demographics   Ariel Mayer, is a 64 y.o. female, DOB - [birth year], ZOX:[id number]  Admit date - 12/19/2014   Admitting Physician Shaune Pollack, MD  Outpatient Primary MD for the patient is No primary care provider on file.   LOS - 1  Subjective: Patient admitted with Etoh Withdrawal, as well as some melana. Patient is doing better from a alcohol withdrawal standpoint. More awake and alert. No further evidence of melanoma was seen by GI   UTIONAL: No documented fever. No fatigue, weakness. No weight gain, no weight loss.  EYES: No blurry or double vision.  ENT: No tinnitus. No postnasal drip. No redness of the oropharynx.  RESPIRATORY: No cough, no wheeze, no hemoptysis. No dyspnea.  CARDIOVASCULAR: No chest pain. No orthopnea. No palpitations. No syncope.  GASTROINTESTINAL: No nausea, no vomiting or diarrhea. No abdominal pain.positive  melena or hematochezia.  GENITOURINARY: No dysuria or hematuria.  ENDOCRINE: No polyuria or nocturia. No heat or cold intolerance.  HEMATOLOGY: No anemia. No bruising. No bleeding.  INTEGUMENTARY: No rashes. No lesions.  MUSCULOSKELETAL: No arthritis. No swelling. No gout.  NEUROLOGIC: No numbness, tingling, or ataxia. No seizure-type activity.  PSYCHIATRIC: No anxiety. No insomnia. No ADD.    Vitals:   Filed Vitals:   12/20/14 0900 12/20/14 1000 12/20/14 1100 12/20/14 1243  BP: 150/81 116/54 134/72 135/59  Pulse: 103 85 85 93  Temp:  97.7 F (36.5 C)  98.7 F (37.1 C)  TempSrc:  Oral  Oral  Resp: 16 15 15 16   Height:      Weight:      SpO2: 95% 94% 93% 96%    Wt Readings from Last 3 Encounters:  12/19/14 68.04 kg (150 lb)     Intake/Output Summary (Last 24 hours) at 12/20/14  1254 Last data filed at 12/20/14 0600  Gross per 24 hour  Intake 1553.17 ml  Output    700 ml  Net 853.17 ml    Physical Exam:   GENERAL: Pleasant chronically ill -appearing in no apparent distress.  HEAD, EYES, EARS, NOSE AND THROAT: Atraumatic, normocephalic. Extraocular muscles are intact. Pupils equal and reactive to light. Sclerae anicteric. No conjunctival injection. No oro-pharyngeal erythema.  NECK: Supple. There is no jugular venous distention. No bruits, no lymphadenopathy, no thyromegaly.  HEART: Regular rate and rhythm, tachycardic. No murmurs, no rubs, no clicks.  LUNGS: Clear to auscultation bilaterally. No rales or rhonchi. No wheezes.  ABDOMEN: Soft, flat, nontender, nondistended. Has good bowel sounds. No hepatosplenomegaly appreciated.  EXTREMITIES: No evidence of any cyanosis, clubbing, or peripheral edema.  +2 pedal and radial pulses bilaterally.  NEUROLOGIC: The patient is alert, awake, and oriented x3 with no focal motor or sensory deficits appreciated bilaterally.  SKIN: Moist and warm with no rashes appreciated.  Psych: Not anxious, depressed LN: No inguinal LN enlargement    Antibiotics   Anti-infectives    None      Medications   Scheduled Meds: . antiseptic oral rinse  7 mL Mouth Rinse BID  . LORazepam  0-4 mg Intravenous Q6H   Followed by  . [START ON 12/21/2014] LORazepam  0-4 mg Intravenous Q12H  . mirtazapine  30 mg Oral QHS  . pantoprazole (PROTONIX) IV  40 mg Intravenous Q12H  . perphenazine  2 mg Oral QHS   Continuous Infusions: . sodium chloride 75 mL/hr at 12/20/14 0535  . banana bag IV 1000 mL 40 mL/hr at 12/19/14 1729   PRN Meds:.acetaminophen **OR** acetaminophen, albuterol, HYDROcodone-acetaminophen, LORazepam **OR** LORazepam, ondansetron **OR** ondansetron (ZOFRAN) IV   Data Review:   Micro Results Recent Results (from the past 240 hour(s))  MRSA PCR Screening     Status: None   Collection Time: 12/19/14  4:02 PM  Result  Value Ref Range Status   MRSA by PCR NEGATIVE NEGATIVE Final    Comment:        The GeneXpert MRSA Assay (FDA approved for NASAL specimens only), is one component of a comprehensive MRSA colonization surveillance program. It is not intended to diagnose MRSA infection nor to guide or monitor treatment for MRSA infections.     Radiology Reports Ct Abdomen Pelvis W Contrast  12/19/2014   CLINICAL DATA:  Left-sided abdominal pain after falling last Sunday. Tremors with dark stools. History of hysterectomy and appendectomy. Initial encounter.  EXAM: CT ABDOMEN AND PELVIS WITH CONTRAST  TECHNIQUE: Multidetector CT imaging of the abdomen and pelvis was performed using the standard protocol following bolus administration of intravenous contrast.  CONTRAST:  OMNIPAQUE IOHEXOL 300 MG/ML  SOLN  COMPARISON:  CT 03/16/2011.  FINDINGS: Lower chest: Clear lung bases. No significant pleural or pericardial effusion. The heart is mildly enlarged. There is atherosclerosis of the aorta and coronary arteries. Distal esophageal varices noted.  Hepatobiliary: The liver demonstrates severe steatosis with contour irregularity and diffuse micro nodularity consistent with cirrhosis. No focal enhancing lesions identified. The portal and hepatic veins are patent. The left paraumbilical vein is recanalized. The gallbladder is distended without calcified stones, significant wall thickening or biliary dilatation.  Pancreas: Unremarkable. No pancreatic ductal dilatation or surrounding inflammatory changes.  Spleen: The spleen is not significantly enlarged. There is a stable cyst inferiorly within the spleen.  Adrenals/Urinary Tract: Both adrenal glands appear normal.The kidneys appear normal without evidence of urinary tract calculus, suspicious lesion or hydronephrosis. No bladder abnormalities are seen.  Stomach/Bowel: The stomach and small bowel demonstrate no significant findings. There is prominent wall thickening within  the right colon. No significant surrounding inflammation identified. Appendix non visualized. The distal colon demonstrates mild diverticulosis without wall thickening.  Vascular/Lymphatic: Stable mild reactive adenopathy in the porta hepatis. No retroperitoneal lymphadenopathy. Stable atherosclerosis of the aorta, its branches and iliac arteries. As above, there is re- cannulization of the left periumbilical vein and distal esophageal varices.  Reproductive: Status post hysterectomy.  No adnexal mass.  Other: Mild mesenteric edema  without ascites.  Musculoskeletal: No acute or significant osseous findings. Stable lumbar spine facet degenerative changes.  IMPRESSION: 1. Cirrhosis with increased steatosis, but no apparent focal hepatic lesion. 2. Portal hypertension manifesting as distal esophageal varices and re- cannulization of the left paraumbilical vein. No significant splenomegaly. 3. Right colon wall thickening could be related to the patient's liver disease, although is suspicious for colitis. No evidence of bowel obstruction or abscess. 4. Moderate diffuse atherosclerosis. 5. Gallbladder hydrops without wall thickening or biliary dilatation.   Electronically Signed   By: Carey Bullocks M.D.   On: 12/19/2014 12:54     CBC  Recent Labs Lab 12/19/14 0942 12/19/14 1740 12/20/14 0028 12/20/14 0444  WBC 7.3  --   --  5.8  HGB 15.9 14.7 13.5 13.7  HCT 46.5  --   --  40.2  PLT 75*  --   --  49*  MCV 107.3*  --   --  107.8*  MCH 36.6*  --   --  36.8*  MCHC 34.1  --   --  34.1  RDW 13.4  --   --  13.2    Chemistries   Recent Labs Lab 12/19/14 0942 12/19/14 1740 12/20/14 0444  NA 135  --  138  K 4.2  --  3.7  CL 101  --  105  CO2 23  --  25  GLUCOSE 125*  --  76  BUN 10  --  10  CREATININE 0.58  --  0.45  CALCIUM 8.7*  --  8.2*  MG  --  1.7  --   AST 596*  --   --   ALT 152*  --   --   ALKPHOS 89  --   --   BILITOT 2.8*  --   --     ------------------------------------------------------------------------------------------------------------------ estimated creatinine clearance is 77.3 mL/min (by C-G formula based on Cr of 0.45). ------------------------------------------------------------------------------------------------------------------ No results for input(s): HGBA1C in the last 72 hours. ------------------------------------------------------------------------------------------------------------------ No results for input(s): CHOL, HDL, LDLCALC, TRIG, CHOLHDL, LDLDIRECT in the last 72 hours. ------------------------------------------------------------------------------------------------------------------ No results for input(s): TSH, T4TOTAL, T3FREE, THYROIDAB in the last 72 hours.  Invalid input(s): FREET3 ------------------------------------------------------------------------------------------------------------------ No results for input(s): VITAMINB12, FOLATE, FERRITIN, TIBC, IRON, RETICCTPCT in the last 72 hours.  Coagulation profile  Recent Labs Lab 12/19/14 0942  INR 1.36    No results for input(s): DDIMER in the last 72 hours.  Cardiac Enzymes No results for input(s): CKMB, TROPONINI, MYOGLOBIN in the last 168 hours.  Invalid input(s): CK ------------------------------------------------------------------------------------------------------------------ Invalid input(s): POCBNP    Assessment & Plan    #1 Alcohol withdrawal continue ciwa protocol #2 possible GI bleed appreciate GI input hemoglobin stable continue PPIs #3 Esophageal varices if blood pressure allows. Can start on propranolol #4 Liver cirrhosis due to combination of hepatitis C and alcohol abuse #5 Abnormal liver function test, due to liver cirrhosis and alcohol abuse. #6 Thrombocytopenia due to liver cirrhosis  Transfer patient to the floor and started on a clear liquid diet      Code Status Orders        Start      Ordered   12/19/14 1544  Do not attempt resuscitation (DNR)   Continuous    Question Answer Comment  In the event of cardiac or respiratory ARREST Do not call a "code blue"   In the event of cardiac or respiratory ARREST Do not perform Intubation, CPR, defibrillation or ACLS   In the event of cardiac or  respiratory ARREST Use medication by any route, position, wound care, and other measures to relive pain and suffering. May use oxygen, suction and manual treatment of airway obstruction as needed for comfort.      12/19/14 1543    Advance Directive Documentation        Most Recent Value   Type of Advance Directive  Living will   Pre-existing out of facility DNR order (yellow form or pink MOST form)     "MOST" Form in Place?             ConsultsGI DVT Prophylaxis  - SCDs   Lab Results  Component Value Date   PLT 49* 12/20/2014     Time Spent in minutes    Greater than 50% of time spent in care coordination and counseling.   Auburn Bilberry M.D on 12/20/2014 at 12:54 PM  Between 7am to 6pm - Pager - [phone]  After 6pm go to [email or web address] - password [email or web address] [facility]  [facility]   Office  [phone]

## 2014-12-20 NOTE — Consult Note (Signed)
Pt with melena, cirrhosis, ETOH abuse, Hep C.  Likely melena coming from portal hypertensive gastritis, erosive gastritis, duodenal inflammation or ulcers, etc,  Very unlikely from varices given the clinical picture.  Pt will likely leave AMA after she goes through alcohol withdrawal.  No plans for EGD if she remains stable without drastic drop in Hgb.  Recommend PPI and carafate slurry and possibly go home on those meds.   Dr. Bluford Kaufmann to cover this weekend.

## 2014-12-20 NOTE — Progress Notes (Addendum)
ERROR

## 2014-12-21 ENCOUNTER — Encounter: Payer: Self-pay | Admitting: Gastroenterology

## 2014-12-21 MED ORDER — OXYCODONE HCL 5 MG PO TABS
5.0000 mg | ORAL_TABLET | Freq: Four times a day (QID) | ORAL | Status: DC | PRN
  Administered 2014-12-21: 5 mg via ORAL
  Filled 2014-12-21: qty 1

## 2014-12-21 MED ORDER — MAGNESIUM OXIDE 400 (241.3 MG) MG PO TABS
400.0000 mg | ORAL_TABLET | Freq: Two times a day (BID) | ORAL | Status: DC
  Administered 2014-12-21 – 2014-12-23 (×5): 400 mg via ORAL
  Filled 2014-12-21 (×5): qty 1

## 2014-12-21 MED ORDER — NADOLOL 40 MG PO TABS
20.0000 mg | ORAL_TABLET | Freq: Every day | ORAL | Status: DC
  Administered 2014-12-21 – 2014-12-22 (×2): 20 mg via ORAL
  Filled 2014-12-21 (×2): qty 1

## 2014-12-21 NOTE — Consult Note (Signed)
  GI Inpatient Follow-up Note  Patient Identification: Ariel Mayer is a 64 y.o. female with liver cirrhosis and esophageal varices. Covering for Dr. Mechele Collin.  Subjective: Slowly improving. Black stools but hgb stable. On clear liquid diet. Still encephalopathic.  Scheduled Inpatient Medications:  . antiseptic oral rinse  7 mL Mouth Rinse BID  . LORazepam  0-4 mg Intravenous Q12H  . mirtazapine  30 mg Oral QHS  . pantoprazole (PROTONIX) IV  40 mg Intravenous Q12H  . perphenazine  2 mg Oral QHS    Continuous Inpatient Infusions:   . sodium chloride 75 mL/hr at 12/20/14 1740  . banana bag IV 1000 mL 40 mL/hr at 12/20/14 2122    PRN Inpatient Medications:  acetaminophen **OR** acetaminophen, albuterol, HYDROcodone-acetaminophen, LORazepam **OR** LORazepam, ondansetron **OR** ondansetron (ZOFRAN) IV  Review of Systems: Constitutional: Weight is stable.  Eyes: No changes in vision. ENT: No oral lesions, sore throat.  GI: see HPI.  Heme/Lymph: No easy bruising.  CV: No chest pain.  GU: No hematuria.  Integumentary: No rashes.  Neuro: No headaches.  Psych: No depression/anxiety.  Endocrine: No heat/cold intolerance.  Allergic/Immunologic: No urticaria.  Resp: No cough, SOB.  Musculoskeletal: No joint swelling.    Physical Examination: BP 136/61 mmHg  Pulse 81  Temp(Src) 98.5 F (36.9 C) (Oral)  Resp 18  Ht  (1.803 m)  Wt 68.04 kg (150 lb)  BMI 20.93 kg/m2  SpO2 98% Gen: NAD,lethargic. No asterixis. HEENT: PEERLA, EOMI, Neck: supple, no JVD or thyromegaly Chest: CTA bilaterally, no wheezes, crackles, or other adventitious sounds CV: RRR, no m/g/c/r Abd: soft, NT, ND, +BS in all four quadrants; no HSM, guarding, ridigity, or rebound tenderness Ext: no edema, well perfused with 2+ pulses, Skin: no rash or lesions noted Lymph: no LAD  Data: Lab Results  Component Value Date   WBC 5.8 12/20/2014   HGB 13.7 12/20/2014   HCT 40.2 12/20/2014   MCV 107.8*  12/20/2014   PLT 49* 12/20/2014    Recent Labs Lab 12/19/14 1740 12/20/14 0028 12/20/14 0444  HGB 14.7 13.5 13.7   Lab Results  Component Value Date   NA 138 12/20/2014   K 3.7 12/20/2014   CL 105 12/20/2014   CO2 25 12/20/2014   BUN 10 12/20/2014   CREATININE 0.45 12/20/2014   Lab Results  Component Value Date   ALT 152* 12/19/2014   AST 596* 12/19/2014   ALKPHOS 89 12/19/2014   BILITOT 2.8* 12/19/2014    Recent Labs Lab 12/19/14 0942  INR 1.36   Assessment/Plan: [name] is a 64 y.o. female with gradual UGI bleeding.  Recommendations: Continue PPI and carafate slurry. No plan for EGD unless active bleeding. Consider use of nadolol or inderal to lower portal pressures. THanks Please call with questions or concerns.  Kenderick Kobler, Ezzard Standing, MD

## 2014-12-21 NOTE — Progress Notes (Signed)
Entered room to fed pt; pt took one bite of jello turned over to opposite side of nurse and said "get your ugly ass out of here " and refused meal  , nurse  Left pt room

## 2014-12-21 NOTE — Progress Notes (Signed)
Patient ID: Ariel Mayer, female   DOB: 1951/04/30, 64 y.o.   MRN: [id number] [facility] PROGRESS NOTE  PCP: Patient lives in [place]  HPI/Subjective: Patient feeling weak. She can hardly move her arms and legs when she came in. Now she is doing a little bit better with regards to that. Feels pain all throughout her body. Son states that she drinks 2-3 bottles of wine per day. Patient still slurring a little bit of her words today.  Objective: Filed Vitals:   12/21/14 0728  BP: 136/61  Pulse: 81  Temp: 98.5 F (36.9 C)  Resp: 18    Intake/Output Summary (Last 24 hours) at 12/21/14 1243 Last data filed at 12/21/14 1000  Gross per 24 hour  Intake      0 ml  Output   1725 ml  Net  -1725 ml   Filed Weights   12/19/14 0927  Weight: 68.04 kg (150 lb)    ROS: Review of Systems  Constitutional: Negative for fever and chills.  Eyes: Negative for blurred vision.  Respiratory: Negative for cough and shortness of breath.   Cardiovascular: Negative for chest pain.  Gastrointestinal: Negative for nausea, vomiting, abdominal pain, diarrhea and constipation.  Genitourinary: Negative for dysuria.  Musculoskeletal: Negative for joint pain.  Neurological: Negative for dizziness and headaches.   Exam: Physical Exam  HENT:  Nose: No mucosal edema.  Mouth/Throat: No oropharyngeal exudate or posterior oropharyngeal edema.  Eyes: Conjunctivae, EOM and lids are normal. Pupils are equal, round, and reactive to light.  Neck: No JVD present. Carotid bruit is not present. No edema present. No thyroid mass and no thyromegaly present.  Cardiovascular: S1 normal and S2 normal.  Exam reveals no gallop.   No murmur heard. Pulses:      Dorsalis pedis pulses are 2+ on the right side, and 2+ on the left side.  Respiratory: No respiratory distress. She has no wheezes. She has no rhonchi. She has no rales.  GI: Soft. Bowel sounds are normal. There is no tenderness.  Musculoskeletal:        Right ankle: She exhibits no swelling.       Left ankle: She exhibits no swelling.  Lymphadenopathy:    She has no cervical adenopathy.  Neurological: She is alert. No cranial nerve deficit.  Patient able to straight leg raise bilaterally.  Skin: Skin is warm. Nails show no clubbing.  Numerous scabs over the lower extremity. Lower extremity discoloration bilaterally. Skin tear covered on the left arm. Bruising on the arms.  Psychiatric: She has a normal mood and affect.    Data Reviewed: Basic Metabolic Panel:  Recent Labs Lab 12/19/14 0942 12/19/14 1740 12/20/14 0444  NA 135  --  138  K 4.2  --  3.7  CL 101  --  105  CO2 23  --  25  GLUCOSE 125*  --  76  BUN 10  --  10  CREATININE 0.58  --  0.45  CALCIUM 8.7*  --  8.2*  MG  --  1.7  --    Liver Function Tests:  Recent Labs Lab 12/19/14 0942  AST 596*  ALT 152*  ALKPHOS 89  BILITOT 2.8*  PROT 7.8  ALBUMIN 3.9    Recent Labs Lab 12/19/14 0942  LIPASE 53*  AMYLASE 52   CBC:  Recent Labs Lab 12/19/14 0942 12/19/14 1740 12/20/14 0028 12/20/14 0444  WBC 7.3  --   --  5.8  HGB 15.9 14.7 13.5 13.7  HCT 46.5  --   --  40.2  MCV 107.3*  --   --  107.8*  PLT 75*  --   --  49*    Scheduled Meds: . antiseptic oral rinse  7 mL Mouth Rinse BID  . LORazepam  0-4 mg Intravenous Q12H  . mirtazapine  30 mg Oral QHS  . pantoprazole (PROTONIX) IV  40 mg Intravenous Q12H  . perphenazine  2 mg Oral QHS   Continuous Infusions: . banana bag IV 1000 mL 40 mL/hr at 12/20/14 2122    Assessment/Plan:  1. Alcohol withdrawal and acute encephalopathy. Mental status better today as per son. Continue Ciwa protocol for alcohol withdrawal. 2. History of cirrhosis and hepatitis C. I told her that she is slowly killing herself with alcohol if she must stop drinking. Patient also has thrombocytopenia and coagulopathy and elevated liver function test. 3. Suspected GI bleed. I doubt GI bleed. Hemoglobin did drift down  with IV fluid hydration I will stop IV fluid hydration. Advance diet. 4. History of esophageal varices- I will start low-dose atenolol. 5. Hypomagnesemia- oral supplementation. 6. Patient lives in [place] and drove herself here. I spoke with son at bedside and he is thinking that the patient may stay with him.  Code Status:     Code Status Orders        Start     Ordered   12/19/14 1544  Do not attempt resuscitation (DNR)   Continuous    Question Answer Comment  In the event of cardiac or respiratory ARREST Do not call a "code blue"   In the event of cardiac or respiratory ARREST Do not perform Intubation, CPR, defibrillation or ACLS   In the event of cardiac or respiratory ARREST Use medication by any route, position, wound care, and other measures to relive pain and suffering. May use oxygen, suction and manual treatment of airway obstruction as needed for comfort.      12/19/14 1543    Advance Directive Documentation        Most Recent Value   Type of Advance Directive  Living will   Pre-existing out of facility DNR order (yellow form or pink MOST form)     "MOST" Form in Place?       Family Communication: Son at bedside Disposition Plan: Potentially home with son on Monday  Consultants:  Gastrointestinal  Time spent: 25 minutes  Alford Highland  [facility]

## 2014-12-22 LAB — COMPREHENSIVE METABOLIC PANEL
ALBUMIN: 3 g/dL — AB (ref 3.5–5.0)
ALT: 108 U/L — ABNORMAL HIGH (ref 14–54)
AST: 250 U/L — AB (ref 15–41)
Alkaline Phosphatase: 75 U/L (ref 38–126)
Anion gap: 6 (ref 5–15)
BUN: <5 mg/dL — ABNORMAL LOW (ref 6–20)
CALCIUM: 8.3 mg/dL — AB (ref 8.9–10.3)
CO2: 28 mmol/L (ref 22–32)
Chloride: 102 mmol/L (ref 101–111)
Creatinine, Ser: 0.48 mg/dL (ref 0.44–1.00)
GFR calc Af Amer: >60 mL/min (ref >60)
GFR calc non Af Amer: >60 mL/min (ref >60)
Glucose, Bld: 130 mg/dL — ABNORMAL HIGH (ref 65–99)
Potassium: 3.2 mmol/L — ABNORMAL LOW (ref 3.5–5.1)
Sodium: 136 mmol/L (ref 135–145)
Total Bilirubin: 3 mg/dL — ABNORMAL HIGH (ref 0.3–1.2)
Total Protein: 6.5 g/dL (ref 6.5–8.1)

## 2014-12-22 LAB — CBC
HEMATOCRIT: 42 % (ref 35.0–47.0)
Hemoglobin: 14.5 g/dL (ref 12.0–16.0)
MCH: 37.3 pg — ABNORMAL HIGH (ref 26.0–34.0)
MCHC: 34.6 g/dL (ref 32.0–36.0)
MCV: 107.8 fL — AB (ref 80.0–100.0)
PLATELETS: 60 10*3/uL — AB (ref 150–440)
RBC: 3.89 MIL/uL (ref 3.80–5.20)
RDW: 13.2 % (ref 11.5–14.5)
WBC: 5.1 10*3/uL (ref 3.6–11.0)

## 2014-12-22 LAB — MAGNESIUM: Magnesium: 1.5 mg/dL — ABNORMAL LOW (ref 1.7–2.4)

## 2014-12-22 LAB — AMMONIA: Ammonia: 53 umol/L — ABNORMAL HIGH (ref 9–35)

## 2014-12-22 MED ORDER — LACTULOSE 10 GM/15ML PO SOLN
30.0000 g | Freq: Two times a day (BID) | ORAL | Status: DC
  Administered 2014-12-22 – 2014-12-23 (×3): 30 g via ORAL
  Filled 2014-12-22 (×3): qty 60

## 2014-12-22 MED ORDER — MAGNESIUM SULFATE 2 GM/50ML IV SOLN
2.0000 g | Freq: Once | INTRAVENOUS | Status: AC
  Administered 2014-12-22: 2 g via INTRAVENOUS
  Filled 2014-12-22: qty 50

## 2014-12-22 MED ORDER — LORAZEPAM 2 MG/ML IJ SOLN: 0.5000 mg | Freq: Four times a day (QID) | INTRAMUSCULAR | Status: DC | PRN

## 2014-12-22 MED ORDER — LORAZEPAM 0.5 MG PO TABS: 0.5000 mg | ORAL_TABLET | Freq: Four times a day (QID) | ORAL | Status: DC | PRN

## 2014-12-22 MED ORDER — POTASSIUM CHLORIDE CRYS ER 20 MEQ PO TBCR
20.0000 meq | EXTENDED_RELEASE_TABLET | Freq: Two times a day (BID) | ORAL | Status: DC
  Administered 2014-12-22 – 2014-12-23 (×3): 20 meq via ORAL
  Filled 2014-12-22 (×3): qty 1

## 2014-12-22 NOTE — Progress Notes (Signed)
Patient ID: Ariel Mayer, female   DOB: Oct 26, 1950, 64 y.o.   MRN: [id number] [facility] PROGRESS NOTE  PCP: Patient lives in [place]  HPI/Subjective: Patient feels weak. States she felt feels like she got run over by a truck. He had a fall prior to coming into the hospital. She thinks she did well with physical therapy today.  Objective: Filed Vitals:   12/22/14 0915  BP: 124/63  Pulse: 63  Temp: 98.9 F (37.2 C)  Resp: 18    Intake/Output Summary (Last 24 hours) at 12/22/14 1200 Last data filed at 12/22/14 0926  Gross per 24 hour  Intake   2321 ml  Output   1630 ml  Net    691 ml   Filed Weights   12/19/14 0927  Weight: 68.04 kg (150 lb)    ROS: Review of Systems  Constitutional: Negative for fever and chills.  Eyes: Negative for blurred vision.  Respiratory: Negative for cough and shortness of breath.   Cardiovascular: Negative for chest pain.  Gastrointestinal: Negative for nausea, vomiting, abdominal pain, diarrhea and constipation.  Genitourinary: Negative for dysuria.  Musculoskeletal: Negative for joint pain.  Neurological: Negative for dizziness and headaches.   Exam: Physical Exam  HENT:  Nose: No mucosal edema.  Mouth/Throat: No oropharyngeal exudate or posterior oropharyngeal edema.  Eyes: Conjunctivae, EOM and lids are normal. Pupils are equal, round, and reactive to light.  Neck: No JVD present. Carotid bruit is not present. No edema present. No thyroid mass and no thyromegaly present.  Cardiovascular: S1 normal and S2 normal.  Exam reveals no gallop.   No murmur heard. Pulses:      Dorsalis pedis pulses are 2+ on the right side, and 2+ on the left side.  Respiratory: No respiratory distress. She has no wheezes. She has no rhonchi. She has no rales.  GI: Soft. Bowel sounds are normal. There is no tenderness.  Musculoskeletal:       Right ankle: She exhibits no swelling.       Left ankle: She exhibits no swelling.   Lymphadenopathy:    She has no cervical adenopathy.  Neurological: She is alert. No cranial nerve deficit.  Patient able to straight leg raise bilaterally.  Skin: Skin is warm. Nails show no clubbing.  Numerous scabs over the lower extremity. Lower extremity discoloration bilaterally. Skin tear covered on the left arm. Bruising on the arms.  Psychiatric: She has a normal mood and affect.    Data Reviewed: Basic Metabolic Panel:  Recent Labs Lab 12/19/14 0942 12/19/14 1740 12/20/14 0444 12/22/14 0400  NA 135  --  138 136  K 4.2  --  3.7 3.2*  CL 101  --  105 102  CO2 23  --  25 28  GLUCOSE 125*  --  76 130*  BUN 10  --  10 <5*  CREATININE 0.58  --  0.45 0.48  CALCIUM 8.7*  --  8.2* 8.3*  MG  --  1.7  --  1.5*   Liver Function Tests:  Recent Labs Lab 12/19/14 0942 12/22/14 0400  AST 596* 250*  ALT 152* 108*  ALKPHOS 89 75  BILITOT 2.8* 3.0*  PROT 7.8 6.5  ALBUMIN 3.9 3.0*    Recent Labs Lab 12/19/14 0942  LIPASE 53*  AMYLASE 52   CBC:  Recent Labs Lab 12/19/14 0942 12/19/14 1740 12/20/14 0028 12/20/14 0444 12/22/14 0400  WBC 7.3  --   --  5.8 5.1  HGB 15.9 14.7 13.5  13.7 14.5  HCT 46.5  --   --  40.2 42.0  MCV 107.3*  --   --  107.8* 107.8*  PLT 75*  --   --  49* 60*    Scheduled Meds: . antiseptic oral rinse  7 mL Mouth Rinse BID  . lactulose  30 g Oral BID  . LORazepam  0-4 mg Intravenous Q12H  . magnesium oxide  400 mg Oral BID  . mirtazapine  30 mg Oral QHS  . nadolol  20 mg Oral QHS  . pantoprazole (PROTONIX) IV  40 mg Intravenous Q12H  . perphenazine  2 mg Oral QHS  . potassium chloride  20 mEq Oral BID   Continuous Infusions: . banana bag IV 1000 mL 40 mL/hr at 12/21/14 1654    Assessment/Plan:  1. Alcohol withdrawal and acute encephalopathy. Mental status improving daily. Patient still has slurred speech. Has not received much Ativan as per nursing staff. 2. History of cirrhosis and hepatitis C. I told her that she is slowly  killing herself with alcohol if she must stop drinking. Patient also has thrombocytopenia and coagulopathy and elevated liver function test. LFTs trending better. 3. Hepatic encephalopathy- start low-dose lactulose for elevated ammonia level. 4. Suspected GI bleed. I doubt GI bleed. Hemoglobin stable. 5. History of esophageal varices- I will start low-dose nadolol. 6. Hypomagnesemia, hypokalemia- replace magnesium orally and IV and oral potassium supplementation. 7. Patient lives in [place] and drove herself here. I spoke with son at bedside and he is thinking that the patient may stay with him. 8. Weakness- physical therapy recommended rehabilitation will get social worker involved. 9. Continued to have slurred speech- we'll get an MRI of the brain to rule out stroke. We will be limited on treatments with her suspected GI bleed on upon admission.  Code Status:     Code Status Orders        Start     Ordered   12/19/14 1544  Do not attempt resuscitation (DNR)   Continuous    Question Answer Comment  In the event of cardiac or respiratory ARREST Do not call a "code blue"   In the event of cardiac or respiratory ARREST Do not perform Intubation, CPR, defibrillation or ACLS   In the event of cardiac or respiratory ARREST Use medication by any route, position, wound care, and other measures to relive pain and suffering. May use oxygen, suction and manual treatment of airway obstruction as needed for comfort.      12/19/14 1543    Advance Directive Documentation        Most Recent Value   Type of Advance Directive  Living will   Pre-existing out of facility DNR order (yellow form or pink MOST form)     "MOST" Form in Place?       Family Communication: Ex-husband at bedside Disposition Plan: Potentially to rehabilitation soon  Consultants:  Gastrointestinal  Time spent: 25 minutes  Alford Highland  [facility]

## 2014-12-22 NOTE — Progress Notes (Signed)
Physical Therapy Evaluation Patient Details Name: Daytona Hedman MRN: [id number] DOB: 01/13/1951 Today's Date: 12/22/2014   History of Present Illness  Patient is a 64 y.o. female who was admitted on 21 July for possible GI bleed and alcohol withdrawal. Reports that she fell onto R side last weekend tripping on a rug and injured L elbow. Patient reports driving to [facility] from [place]. Has son and grandaughter who live locally. Patient has hx of alcohol abuse and hepatitis C.  Clinical Impression  Patient demonstrated willingness to participate in PT assessment but was limited by lethargy/"shakiness." Patient previously was independent, living in [place] by herself. Patient demonstrated unsteady gait pattern with RW, requiring verbal/tactile cues for correction. Patient's son and granddaughter live locally; however, both are working individuals. It is believed at this time, she would be safest attending rehabilitation at a SNF to improve gait and increase strength, returning her to PLOF.    Follow Up Recommendations SNF    Equipment Recommendations  Rolling walker with 5" wheels    Recommendations for Other Services Rehab consult     Precautions / Restrictions Precautions Precautions: Fall Restrictions Weight Bearing Restrictions: No      Mobility  Bed Mobility Overal bed mobility: Modified Independent             General bed mobility comments: Patient demonstrates ability to perform supine to sit without assistance but required increased time.  Transfers Overall transfer level: Needs assistance Equipment used: Rolling walker (2 wheeled) Transfers: Sit to/from Stand Sit to Stand: Min guard         General transfer comment: Patient required slight impulsive movement, ignoring verbal safety cues for proper use of RW initially. Was slightly unsteady in standing, requiring occasional assistance to steady RW.  Ambulation/Gait Ambulation/Gait assistance: Min assist Ambulation Distance  (Feet): 75 Feet Assistive device: Rolling walker (2 wheeled) Gait Pattern/deviations: Shuffle;Decreased stride length   Gait velocity interpretation: Below normal speed for age/gender General Gait Details: Patient demonstrated shuffling gait pattern but was able to correct with verbal cues. Required tactile assistance to steady RW on occasion, lacking spatial awareness when navigating room.   Stairs            Wheelchair Mobility    Modified Rankin (Stroke Patients Only)       Balance Overall balance assessment: Needs assistance Sitting-balance support: Feet supported;Single extremity supported Sitting balance-Leahy Scale: Fair     Standing balance support: Bilateral upper extremity supported Standing balance-Leahy Scale: Fair   Single Leg Stance - Right Leg:  (Unable to perform without AD) Single Leg Stance - Left Leg:  (Unable to perform without AD)                         Pertinent Vitals/Pain Pain Assessment: 0-10 Pain Score:  (Complains of everything aching) Pain Descriptors / Indicators: Aching Pain Intervention(s): Limited activity within patient's tolerance    Home Living Family/patient expects to be discharged to:: Private residence Living Arrangements: Alone Available Help at Discharge: Other (Comment) (None, family if she stays locally but son/grandaughter work) Type of Home: House Home Access: Stairs to enter Entrance Stairs-Rails: Can reach both Secretary/administrator of Steps: 7 Home Layout: One level Home Equipment: None      Prior Function Level of Independence: Independent               Hand Dominance        Extremity/Trunk Assessment   Upper Extremity Assessment: Overall WFL for tasks  assessed;Generalized weakness (AROM shoulder flexion 120 deg.; full ROM with assistance)           Lower Extremity Assessment: Overall WFL for tasks assessed;Generalized weakness (Globally 4-/5)         Communication    Communication: No difficulties  Cognition Arousal/Alertness: Lethargic Behavior During Therapy: WFL for tasks assessed/performed Overall Cognitive Status: Within Functional Limits for tasks assessed                      General Comments      Exercises        Assessment/Plan    PT Assessment Patient needs continued PT services  PT Diagnosis Difficulty walking;Generalized weakness   PT Problem List Decreased strength;Decreased activity tolerance;Decreased balance;Decreased mobility;Decreased knowledge of use of DME;Decreased safety awareness  PT Treatment Interventions DME instruction;Gait training;Stair training;Balance training;Therapeutic exercise;Therapeutic activities;Functional mobility training;Patient/family education   PT Goals (Current goals can be found in the Care Plan section) Acute Rehab PT Goals Patient Stated Goal: "I want to go home. How long is this going to last?" PT Goal Formulation: With patient Time For Goal Achievement: 01/04/16 Potential to Achieve Goals: Good    Frequency Min 2X/week   Barriers to discharge Inaccessible home environment;Decreased caregiver support Patient lives alone in [place] and was previously independent.    Co-evaluation               End of Session Equipment Utilized During Treatment: Gait belt;Oxygen Activity Tolerance: Patient tolerated treatment well;Patient limited by lethargy Patient left: in chair;with call bell/phone within reach;with chair alarm set Nurse Communication: Mobility status         Time: 0950-1020 PT Time Calculation (min) (ACUTE ONLY): 30 min   Charges:   PT Evaluation $Initial PT Evaluation Tier I: 1 Procedure     PT G Codes:        Neita Carp, PT, DPT 12/22/2014, 10:34 AM

## 2014-12-22 NOTE — Progress Notes (Signed)
Pt sleeping at present

## 2014-12-22 NOTE — Consult Note (Signed)
  GI Inpatient Follow-up Note  Patient Identification: Oleda Borski is a 64 y.o. female  Subjective: More alert and responsive today. No bleeding. Hgb stable. Started low dose nadolol last night. BP ok.  Scheduled Inpatient Medications:  . antiseptic oral rinse  7 mL Mouth Rinse BID  . LORazepam  0-4 mg Intravenous Q12H  . magnesium oxide  400 mg Oral BID  . mirtazapine  30 mg Oral QHS  . nadolol  20 mg Oral QHS  . pantoprazole (PROTONIX) IV  40 mg Intravenous Q12H  . perphenazine  2 mg Oral QHS  . potassium chloride  20 mEq Oral BID    Continuous Inpatient Infusions:   . banana bag IV 1000 mL 40 mL/hr at 12/21/14 1654    PRN Inpatient Medications:  acetaminophen **OR** acetaminophen, albuterol, LORazepam **OR** LORazepam, ondansetron **OR** ondansetron (ZOFRAN) IV, oxyCODONE  Review of Systems: Constitutional: Weight is stable.  Eyes: No changes in vision. ENT: No oral lesions, sore throat.  GI: see HPI.  Heme/Lymph: No easy bruising.  CV: No chest pain.  GU: No hematuria.  Integumentary: No rashes.  Neuro: No headaches.  Psych: No depression/anxiety.  Endocrine: No heat/cold intolerance.  Allergic/Immunologic: No urticaria.  Resp: No cough, SOB.  Musculoskeletal: No joint swelling.    Physical Examination: BP 124/63 mmHg  Pulse 63  Temp(Src) 98.9 F (37.2 C) (Oral)  Resp 18  Ht  (1.803 m)  Wt 68.04 kg (150 lb)  BMI 20.93 kg/m2  SpO2 93% Gen: NAD, alert and oriented. HEENT: PEERLA, EOMI, Neck: supple, no JVD or thyromegaly Chest: CTA bilaterally, no wheezes, crackles, or other adventitious sounds CV: RRR, no m/g/c/r Abd: soft, NT, ND, +BS in all four quadrants; no HSM, guarding, ridigity, or rebound tenderness Ext: no edema, well perfused with 2+ pulses, Skin: no rash or lesions noted Lymph: no LAD  Data: Lab Results  Component Value Date   WBC 5.1 12/22/2014   HGB 14.5 12/22/2014   HCT 42.0 12/22/2014   MCV 107.8* 12/22/2014   PLT 60*  12/22/2014    Recent Labs Lab 12/20/14 0028 12/20/14 0444 12/22/14 0400  HGB 13.5 13.7 14.5   Lab Results  Component Value Date   NA 136 12/22/2014   K 3.2* 12/22/2014   CL 102 12/22/2014   CO2 28 12/22/2014   BUN <5* 12/22/2014   CREATININE 0.48 12/22/2014   Lab Results  Component Value Date   ALT 108* 12/22/2014   AST 250* 12/22/2014   ALKPHOS 75 12/22/2014   BILITOT 3.0* 12/22/2014    Recent Labs Lab 12/19/14 0942  INR 1.36   Assessment/Plan: Ms. Serres is a 64 y.o. female with recent UGI bleeding. Stopped.   Recommendations: Continue PPI/carafate. Continue nadolol. Dr. Mechele Collin to resume GI care tomorrow. Thanks. Please call with questions or concerns.  Finn Amos, Ezzard Standing, MD

## 2014-12-22 NOTE — Progress Notes (Signed)
Pt doing better today ,stand by assist to bedside commode, feeds self today, brushed teeth,  No complaints of pain

## 2014-12-22 NOTE — Progress Notes (Signed)
Patient compliant and less impulsive this shift.  Patient complains of sweats and tremors, CIWA continued and medicated.  Family present at bedside.  Gave lactulose for elevated Ammonia level.  Continue to monitor CIWA for withdrawal.  Plan for patient to go to snf.

## 2014-12-23 ENCOUNTER — Inpatient Hospital Stay: Payer: BLUE CROSS/BLUE SHIELD

## 2014-12-23 LAB — POTASSIUM: Potassium: 3.6 mmol/L (ref 3.5–5.1)

## 2014-12-23 LAB — MAGNESIUM: Magnesium: 1.8 mg/dL (ref 1.7–2.4)

## 2014-12-23 MED ORDER — VITAMIN B-1 100 MG PO TABS: 100.0000 mg | ORAL_TABLET | Freq: Every day | ORAL | Status: AC

## 2014-12-23 MED ORDER — OXYCODONE HCL 5 MG PO TABS: 5.0000 mg | ORAL_TABLET | Freq: Four times a day (QID) | ORAL | Status: AC | PRN

## 2014-12-23 MED ORDER — POTASSIUM CHLORIDE CRYS ER 20 MEQ PO TBCR: 20.0000 meq | EXTENDED_RELEASE_TABLET | Freq: Two times a day (BID) | ORAL | Status: AC

## 2014-12-23 MED ORDER — ESOMEPRAZOLE SODIUM 20 MG IV SOLR: 20.0000 mg | Freq: Every day | INTRAVENOUS | Status: AC

## 2014-12-23 MED ORDER — GADOBENATE DIMEGLUMINE 529 MG/ML IV SOLN
15.0000 mL | Freq: Once | INTRAVENOUS | Status: AC | PRN
  Administered 2014-12-23: 14 mL via INTRAVENOUS

## 2014-12-23 MED ORDER — PERPHENAZINE 2 MG PO TABS: 2.0000 mg | ORAL_TABLET | Freq: Every day | ORAL | Status: AC

## 2014-12-23 MED ORDER — MIRTAZAPINE 30 MG PO TABS: 30.0000 mg | ORAL_TABLET | Freq: Every day | ORAL | Status: AC

## 2014-12-23 MED ORDER — MAGNESIUM OXIDE 400 (241.3 MG) MG PO TABS: 400.0000 mg | ORAL_TABLET | Freq: Every day | ORAL | Status: AC

## 2014-12-23 MED ORDER — NADOLOL 20 MG PO TABS: 20.0000 mg | ORAL_TABLET | Freq: Every day | ORAL | Status: AC

## 2014-12-23 NOTE — Progress Notes (Signed)
   12/23/14 0930  Clinical Encounter Type  Visited With Patient  Visit Type Initial  Referral From Chaplain  Spiritual Encounters  Spiritual Needs Other (Comment)  Stress Factors  Patient Stress Factors None identified  Chaplain rounded in unit and offered support as applicable. Patient appeared to be cold and I offered a blanket. She accepted. Chaplain Dickey Caamano A. Shourya Macpherson Ext. [phone]

## 2014-12-23 NOTE — Progress Notes (Signed)
Physical Therapy Treatment Patient Details Name: Ariel Mayer MRN: [id number] DOB: Jun 25, 1950 Today's Date: 12/23/2014    History of Present Illness Patient is a 64 y.o. female who was admitted on 21 July for possible GI bleed and alcohol withdrawal. Reports that she fell onto R side last weekend tripping on a rug and injured L elbow. Patient reports driving to [facility] from [place]. Has son and grandaughter who live locally. Patient has hx of alcohol abuse and hepatitis C.    PT Comments    Pt making good progress towards PT goals this date. She is able to ambulate 220 ft on room air and no complaints of fatigue. Her bed mobility and transfers have progressed a great deal as well. Gait shows some unsteadiness and standing balance tends to have some sway. She will continue to benefit from skilled PT in order to address these deficits for safe return home.   Follow Up Recommendations  Home health PT     Equipment Recommendations  Rolling walker with 5" wheels    Recommendations for Other Services       Precautions / Restrictions Precautions Precautions: Fall Restrictions Weight Bearing Restrictions: No    Mobility  Bed Mobility Overal bed mobility: Independent             General bed mobility comments: Requires no assist to perform bed mobility.   Transfers Overall transfer level: Needs assistance Equipment used: Rolling walker (2 wheeled) Transfers: Sit to/from Stand Sit to Stand: Min guard         General transfer comment: Pt remains slightly impulsive, but can execute transfers with good functional strength and no LOB. She is able to perform transfers with or without RW. Still requires cues to slow down and listen to therapist.   Ambulation/Gait Ambulation/Gait assistance: Modified independent (Device/Increase time) Ambulation Distance (Feet): 220 Feet Assistive device: Rolling walker (2 wheeled) (Performed with and without RW) Gait Pattern/deviations:  Shuffle;Decreased stride length Gait velocity: decreased   General Gait Details: Pt ambulated both with and without a RW. Majority of ambulation was performed with RW due to pt's shuffle pattern and slight unsteadiness. Her gait smoothed out considerably with RW, and she stated that she felt more comfortable with RW. Pt was ambulated on room air after finding that at rest and with room air her O2 sat was 95%. During ambulation her O2 sat never fell below 91. Pt stated no fatigue. NAD noted.    Stairs            Wheelchair Mobility    Modified Rankin (Stroke Patients Only)       Balance Overall balance assessment: Needs assistance         Standing balance support: Bilateral upper extremity supported;During functional activity (Slight sway and unsteadiness )                        Cognition Arousal/Alertness: Awake/alert Behavior During Therapy: WFL for tasks assessed/performed Overall Cognitive Status: No family/caregiver present to determine baseline cognitive functioning                      Exercises Other Exercises Other Exercises: Pt performed AROM therapeutic exercise x10 bilaterally with supervision for proper technique: Exercises included ankle pumps, quad sets, glute sets, SLR, hip abd/add, and heel slides and LAQ in sitting.     General Comments General comments (skin integrity, edema, etc.): Note tremors in distal extremeties       Pertinent  Vitals/Pain Pain Assessment: No/denies pain    Home Living                      Prior Function            PT Goals (current goals can now be found in the care plan section) Acute Rehab PT Goals Patient Stated Goal: wishes to go home PT Goal Formulation: With patient Time For Goal Achievement: 01/04/16 Potential to Achieve Goals: Good Progress towards PT goals: Progressing toward goals    Frequency  Min 2X/week    PT Plan Discharge plan needs to be updated    Co-evaluation              End of Session Equipment Utilized During Treatment: Gait belt;Oxygen (O2 reapplied after ambulation) Activity Tolerance: Patient tolerated treatment well Patient left: in chair;with call bell/phone within reach;with chair alarm set     Time: 6045-4098 PT Time Calculation (min) (ACUTE ONLY): 26 min  Charges:                       G CodesBenna Dunks 2015-01-07, 2:47 PM Benna Dunks, SPT. [phone]

## 2014-12-23 NOTE — Progress Notes (Signed)
Clinical Child psychotherapist (CSW) received SNF consult. PT is recommending home health today. RN Case Manager and MD aware of above. CSW will continue to follow and assist as needed.   Jetta Lout, LCSWA [phone]

## 2014-12-23 NOTE — Discharge Summary (Signed)
[facility] -  at [facility]   PATIENT NAME: Ariel Mayer    MR#:  [id number]  DATE OF BIRTH:  1950-08-27  DATE OF ADMISSION:  12/19/2014 ADMITTING PHYSICIAN: Shaune Pollack, MD  DATE OF DISCHARGE: 12/23/2014  PRIMARY CARE PHYSICIAN: No primary care provider on file.    ADMISSION DIAGNOSIS:  Acute abdominal pain [R10.0] Esophageal varices [I85.00] Alcohol withdrawal, uncomplicated [F10.230] Gastrointestinal hemorrhage, unspecified gastritis, unspecified gastrointestinal hemorrhage type [K92.2]  DISCHARGE DIAGNOSIS:  Active Problems:   GIB (gastrointestinal bleeding)   Alcohol withdrawal   SECONDARY DIAGNOSIS:   Past Medical History  Diagnosis Date  . Bipolar 1 disorder   . GERD (gastroesophageal reflux disease)   . Liver cirrhosis   . Barrett esophagus   . Hepatitis C     HOSPITAL COURSE:   1. Alcohol withdrawal. Patient threw went through withdrawal process has not received Ativan in a couple days. 2 hepatic cirrhosis, thrombocytopenia, coagulopathy, elevated liver function tests- I advised the patient and family that she slowly killing herself with the alcohol. If she ever wants to be a candidate for a liver transplant she has to have no drinking for 6 months. 3. Slurred speech- gradually getting better. MRI of the brain was negative for acute event.  4. Suspected GI bleed- patient's hemoglobin remained relatively stable. I started the patient on nadolol to reduce portal pressures. Seen in consultation by GI and no procedure done. 5. Weakness- discharged home with home health. 6. Hypomagnesemia and hypokalemia these were replaced during the hospital course and continued replacement as outpatient. 7. Patient's ammonia level was borderline mental status improved I did start lactulose which caused quite a bit of diarrhea so he stopped it.  DISCHARGE CONDITIONS:   Fair  CONSULTS OBTAINED:  Treatment Team:  Scot Jun, MD Shaune Pollack, MD  DRUG ALLERGIES:  No Known Allergies  DISCHARGE MEDICATIONS:   Current Discharge Medication List    START taking these medications   Details  esomeprazole (NEXIUM) 20 MG SOLR injection Inject 20 mg into the vein daily before breakfast. Qty: 30 each, Refills: 0    magnesium oxide (MAG-OX) 400 (241.3 MG) MG tablet Take 1 tablet (400 mg total) by mouth daily. Qty: 30 tablet, Refills: 0    mirtazapine (REMERON) 30 MG tablet Take 1 tablet (30 mg total) by mouth at bedtime. Qty: 30 tablet, Refills: 0    nadolol (CORGARD) 20 MG tablet Take 1 tablet (20 mg total) by mouth at bedtime. Qty: 30 tablet, Refills: 0    oxyCODONE (OXY IR/ROXICODONE) 5 MG immediate release tablet Take 1 tablet (5 mg total) by mouth every 6 (six) hours as needed for severe pain. Qty: 30 tablet, Refills: 0    potassium chloride SA (K-DUR,KLOR-CON) 20 MEQ tablet Take 1 tablet (20 mEq total) by mouth 2 (two) times daily. Qty: 30 tablet, Refills: 0    thiamine (VITAMIN B-1) 100 MG tablet Take 1 tablet (100 mg total) by mouth daily. Qty: 30 tablet, Refills: 0      CONTINUE these medications which have CHANGED   Details  perphenazine (TRILAFON) 2 MG tablet Take 1 tablet (2 mg total) by mouth at bedtime. Qty: 30 tablet, Refills: 0      STOP taking these medications     HYDROcodone-acetaminophen (NORCO) 7.5-325 MG per tablet      mirtazapine (REMERON SOL-TAB) 30 MG disintegrating tablet          DISCHARGE INSTRUCTIONS:   Try to schedule follow-up appointment with  Dr. Beverely Risen around 1 week.  If you experience worsening of your admission symptoms, develop shortness of breath, life threatening emergency, suicidal or homicidal thoughts you must seek medical attention immediately by calling 911 or calling your MD immediately  if symptoms less severe.  You Must read complete instructions/literature along with all the possible adverse reactions/side effects for all the Medicines you take and that  have been prescribed to you. Take any new Medicines after you have completely understood and accept all the possible adverse reactions/side effects.   Please note  You were cared for by a hospitalist during your hospital stay. If you have any questions about your discharge medications or the care you received while you were in the hospital after you are discharged, you can call the unit and asked to speak with the hospitalist on call if the hospitalist that took care of you is not available. Once you are discharged, your primary care physician will handle any further medical issues. Please note that NO REFILLS for any discharge medications will be authorized once you are discharged, as it is imperative that you return to your primary care physician (or establish a relationship with a primary care physician if you do not have one) for your aftercare needs so that they can reassess your need for medications and monitor your lab values.    Today   CHIEF COMPLAINT:   Chief Complaint  Patient presents with  . Fall  . Tremors  . Melena    HISTORY OF PRESENT ILLNESS:  Ariel Mayer  is a 64 y.o. female with a known history of alcohol abuse came in with alcohol withdrawal symptoms. She also had slurred speech.   VITAL SIGNS:  Blood pressure 147/75, pulse 57, temperature 98.6 F (37 C), temperature source Axillary, resp. rate 18, height  (1.803 m), weight 68.04 kg (150 lb), SpO2 95 %.  I/O:   Intake/Output Summary (Last 24 hours) at 12/23/14 1352 Last data filed at 12/23/14 1300  Gross per 24 hour  Intake 1430.67 ml  Output      0 ml  Net 1430.67 ml    PHYSICAL EXAMINATION:  GENERAL:  64 y.o.-year-old patient lying in the bed with no acute distress.  EYES: Pupils equal, round, reactive to light and accommodation. No scleral icterus. Extraocular muscles intact.  HEENT: Head atraumatic, normocephalic. Oropharynx and nasopharynx clear.  NECK:  Supple, no jugular venous  distention. No thyroid enlargement, no tenderness.  LUNGS: Normal breath sounds bilaterally, no wheezing, rales,rhonchi or crepitation. No use of accessory muscles of respiration.  CARDIOVASCULAR: S1, S2 normal. No murmurs, rubs, or gallops.  ABDOMEN: Soft, non-tender, non-distended. Bowel sounds present. No organomegaly or mass.  EXTREMITIES: No pedal edema, cyanosis, or clubbing.  NEUROLOGIC: Cranial nerves II through XII are intact. Muscle strength 5/5 in all extremities. Sensation intact. Gait not checked. Slurred speech seems a little bit better than yesterday.  PSYCHIATRIC: The patient is alert and oriented x 3.  SKIN: No obvious rash, lesion, or ulcer.   DATA REVIEW:   CBC  Recent Labs Lab 12/22/14 0400  WBC 5.1  HGB 14.5  HCT 42.0  PLT 60*    Chemistries   Recent Labs Lab 12/22/14 0400 12/23/14 0423  NA 136  --   K 3.2* 3.6  CL 102  --   CO2 28  --   GLUCOSE 130*  --   BUN <5*  --   CREATININE 0.48  --   CALCIUM 8.3*  --  MG 1.5* 1.8  AST 250*  --   ALT 108*  --   ALKPHOS 75  --   BILITOT 3.0*  --     Cardiac Enzymes No results for input(s): TROPONINI in the last 168 hours.  Microbiology Results    RADIOLOGY:  Mr Lodema Pilot Contrast  12/23/2014   CLINICAL DATA:  Larey Seat 8 days ago. Balance disturbance. Right-sided weakness and slurred speech.  EXAM: MRI HEAD WITHOUT AND WITH CONTRAST  TECHNIQUE: Multiplanar, multiecho pulse sequences of the brain and surrounding structures were obtained without and with intravenous contrast.  CONTRAST:  14mL MULTIHANCE GADOBENATE DIMEGLUMINE 529 MG/ML IV SOLN  COMPARISON:  None.  FINDINGS: Diffusion imaging does not show any acute or subacute infarction.  There are mild chronic small-vessel changes of the pons. The cerebellum shows mild generalized atrophy without focal insult. Within the cerebral hemispheres, there is atrophy, gliosis and encephalomalacia in the inferior frontal lobes bilaterally and at the right temporal  tip, with hemosiderin staining. This is likely the result of an old closed head injury. There is atrophy and gliosis in those regions. Elsewhere, there are a few punctate foci of T2 and FLAIR signal in the white matter consistent with mild small vessel disease. No evidence of large vessel infarction. No mass lesion, evidence of acute hemorrhage, hydrocephalus or extra-axial fluid collection. After contrast administration, no abnormal enhancement occurs. No pituitary mass. No inflammatory sinus disease. No skull or skullbase lesion. Major vessels are patent at the base of the brain.  IMPRESSION: No acute finding.  No evidence of recent traumatic injury.  Mild chronic small-vessel change of the brainstem and cerebral hemispheric white matter.  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   Electronically Signed   By: Paulina Fusi M.D.   On: 12/23/2014 13:39    Management plans discussed with the patient, family and they are in agreement.  CODE STATUS:     Code Status Orders        Start     Ordered   12/19/14 1544  Do not attempt resuscitation (DNR)   Continuous    Question Answer Comment  In the event of cardiac or respiratory ARREST Do not call a "code blue"   In the event of cardiac or respiratory ARREST Do not perform Intubation, CPR, defibrillation or ACLS   In the event of cardiac or respiratory ARREST Use medication by any route, position, wound care, and other measures to relive pain and suffering. May use oxygen, suction and manual treatment of airway obstruction as needed for comfort.      12/19/14 1543    Advance Directive Documentation        Most Recent Value   Type of Advance Directive  Living will   Pre-existing out of facility DNR order (yellow form or pink MOST form)     "MOST" Form in Place?        TOTAL TIME TAKING CARE OF THIS PATIENT: 35  minutes.    Alford Highland  M.D on 12/23/2014 at 1:52 PM  Between 7am to 6pm - Pager - [phone]  After 6pm go to [email or web address] - password [email or web address] [facility]  [place] [facility]  Office  [phone]  CC: Primary care physician; No primary care provider on file.

## 2014-12-23 NOTE — Care Management Note (Addendum)
Case Management Note  Patient Details  Name: Ariel Mayer MRN: [id number] Date of Birth: [birth year]  Subjective/Objective:                  Met with patient to discuss discharge planning- after she worked with PT which is now recommending HHPT.  She had nose secretions running from her nose to her upper lip when I met with her. She had a hard time tracking her eyes while we were talking and speech was slurred. She is currently sitting independently in recliner. She states she has NO PCP. She states her physical address is : [address] [place] [address] and telephone is [phone]. She states she lives alone there. I was unsure how reliable patient information was so I contacted her son Ariel Mayer [phone]. Mr. Ariel Mayer said patient (mom) was visiting him here at his [place] home just before going to ED. He states she can stay with him at discharge and he "thought she had a PCP but [employer] Care screwed that all up and made her changed doctors; now I'm not sure where she goes". Patient tells this RNCM that she does not have a PCP which will impact home health services. He states he is the only child and she has a sister that lives near her in [place] but "they are not on speaking terms since Grandmaw passed". He is going to try to find out if she has a PCP but again patient says she does not.   Action/Plan: CSW updated on this concern. Patient can however go to her son's but again will be alone as he works. No PCP which will not allow home health to be arranged until patient can be established. I checked with [facility] and they do not cover [place]. Neither does [facility].I am not checking with [facility] and [facility]. Neither [facility] or [facility] were able to assist the patient due to her insurance. I am checking with [facility].   Expected Discharge Date:                  Expected Discharge Plan:     In-House Referral:  Clinical Social  Work  Discharge planning Services  CM Consult  Post Acute Care Choice:    Choice offered to:  Patient, Adult Children (Son Ariel Mayer [phone])  DME Arranged:    DME Agency:     HH Arranged:    [place] Agency:     Status of Service:     Medicare Important Message Given:    Date Medicare IM Given:    Medicare IM give by:    Date Additional Medicare IM Given:    Additional Medicare Important Message give by:     If discussed at [place] of Stay Meetings, dates discussed:    Additional Comments: Patient's sons address: [address] [place] [address]. Lyn RNCM went back in and woke patient up: patient told Lyn her PCP was with [facility] health Department. Last seen on April 2010 for immunization at health department. [facility] [phone] fax [phone]- Last walk-in appointment 03/21/2014 for foot injury and last visit was 04/2014. I have faxed hospitalization notes to establish with [facility] health center. Still not PCP and no home health after multiple attempts. I have notified patient's son regarding [facility] but patient would have to go to [facility] when they had opening.Information given to patient's son regarding contact  for [place], [place].   Marshell Garfinkel, RN 12/23/2014, 10:45 AM

## 2014-12-23 NOTE — Progress Notes (Signed)
Pt with multiple loose stools  Today. Spoke with loose stools r/t lactulose . md reports stop or hold lactulose

## 2014-12-23 NOTE — Progress Notes (Signed)
Physical Therapy Treatment Patient Details Name: Ariel Mayer MRN: [id number] DOB: 12-24-1950 Today's Date: 12/23/2014    History of Present Illness Patient is a 64 y.o. female who was admitted on 21 July for possible GI bleed and alcohol withdrawal. Reports that she fell onto R side last weekend tripping on a rug and injured L elbow. Patient reports driving to [facility] from [place]. Has son and grandaughter who live locally. Patient has hx of alcohol abuse and hepatitis C.    PT Comments    Pt making good progress towards PT goals this date. She is able to ambulate 220 ft on room air and no complaints of fatigue. Her bed mobility and transfers have progressed a great deal as well. Gait shows some unsteadiness and standing balance tends to have some sway. She will continue to benefit from skilled PT in order to address these deficits for safe return home.   Follow Up Recommendations  Home health PT     Equipment Recommendations  Rolling walker with 5" wheels    Recommendations for Other Services       Precautions / Restrictions Precautions Precautions: Fall Restrictions Weight Bearing Restrictions: No    Mobility  Bed Mobility Overal bed mobility: Independent             General bed mobility comments: Requires no assist to perform bed mobility.   Transfers Overall transfer level: Modified independent Equipment used: Rolling walker (2 wheeled) Transfers: Sit to/from Stand Sit to Stand: Min guard         General transfer comment: Pt remains slightly impulsive, but can execute transfers with good functional strength and no LOB. She is able to perform transfers with or without RW. Still requires cues to slow down and listen to therapist.   Ambulation/Gait Ambulation/Gait assistance: Modified independent (Device/Increase time) Ambulation Distance (Feet): 220 Feet Assistive device: Rolling walker (2 wheeled) (Performed with and without RW) Gait Pattern/deviations:  Shuffle;Decreased stride length Gait velocity: decreased   General Gait Details: Pt ambulated both with and without a RW. Majority of ambulation was performed with RW due to pt's shuffle pattern and slight unsteadiness. Her gait smoothed out considerably with RW, and she stated that she felt more comfortable with RW. Pt was ambulated on room air after finding that at rest and with room air her O2 sat was 95%. During ambulation her O2 sat never fell below 91. Pt stated no fatigue. NAD noted.    Stairs            Wheelchair Mobility    Modified Rankin (Stroke Patients Only)       Balance Overall balance assessment: Needs assistance         Standing balance support: Bilateral upper extremity supported;During functional activity (Slight sway and unsteadiness )                        Cognition Arousal/Alertness: Awake/alert Behavior During Therapy: WFL for tasks assessed/performed Overall Cognitive Status: No family/caregiver present to determine baseline cognitive functioning                      Exercises Other Exercises Other Exercises: Pt performed AROM therapeutic exercise x10 bilaterally with supervision for proper technique: Exercises included ankle pumps, quad sets, glute sets, SLR, hip abd/add, and heel slides and LAQ in sitting.     General Comments General comments (skin integrity, edema, etc.): Note tremors in distal extremeties       Pertinent  Vitals/Pain Pain Assessment: No/denies pain    Home Living                      Prior Function            PT Goals (current goals can now be found in the care plan section) Acute Rehab PT Goals Patient Stated Goal: wishes to go home PT Goal Formulation: With patient Time For Goal Achievement: 01/04/16 Potential to Achieve Goals: Good Progress towards PT goals: Progressing toward goals    Frequency  Min 2X/week    PT Plan Discharge plan needs to be updated    Co-evaluation              End of Session Equipment Utilized During Treatment: Gait belt;Oxygen (O2 reapplied after ambulation) Activity Tolerance: Patient tolerated treatment well Patient left: in chair;with call bell/phone within reach;with chair alarm set     Time: 1610-9604 PT Time Calculation (min) (ACUTE ONLY): 26 min  Charges:                       G CodesBenna Dunks 12-28-14, 1:06 PM  Benna Dunks, SPT. [phone]

## 2014-12-24 NOTE — Care Management (Signed)
Post discharge: 12/24/14 at 1320: Received call from [facility] stating that they received  Faxed discharge information from this [facility] and will call son Gertie Fey [phone] for follow up appointment.

## 2016-02-29 DEATH — deceased

## 2016-09-30 IMAGING — CT CT ABD-PELV W/ CM
1 of 3 series · 13 of 32 positions shown, 18 images · IV contrast (omnipaque)
Comparison: CT 03/16/2011.

CLINICAL DATA: Left-sided abdominal pain after falling last [REDACTED].
Tremors with dark stools. History of hysterectomy and appendectomy.
Initial encounter.

EXAM:
CT ABDOMEN AND PELVIS WITH CONTRAST
TECHNIQUE: Multidetector CT imaging of the abdomen and pelvis was performed
using the standard protocol following bolus administration of
intravenous contrast.
CONTRAST:  100mL OMNIPAQUE IOHEXOL 300 MG/ML  SOLN

[Series 2: routine abd pel with · axial · 0.67mm/px · z∈[-504,-104]mm · 13 of 91 slices shown, 18 images]
[im 6/91  soft-tissue]
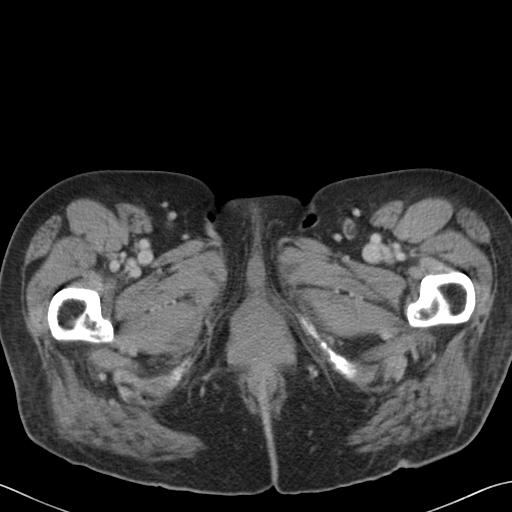
[im 6/91  bone]
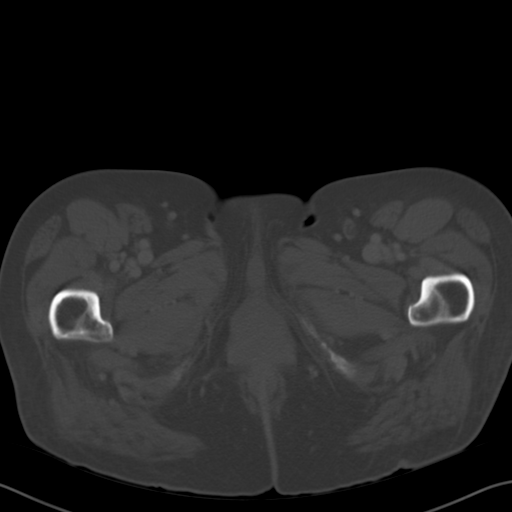
[im 16/91  soft-tissue]
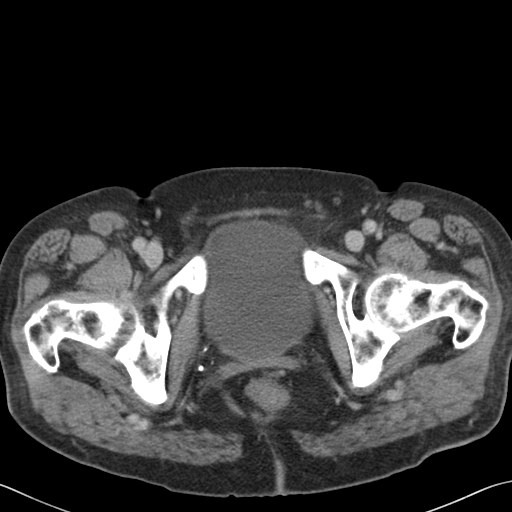
[im 21/91  soft-tissue]
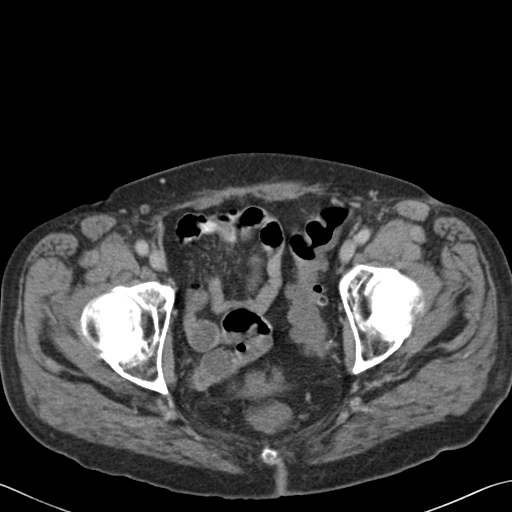
[im 26/91  soft-tissue]
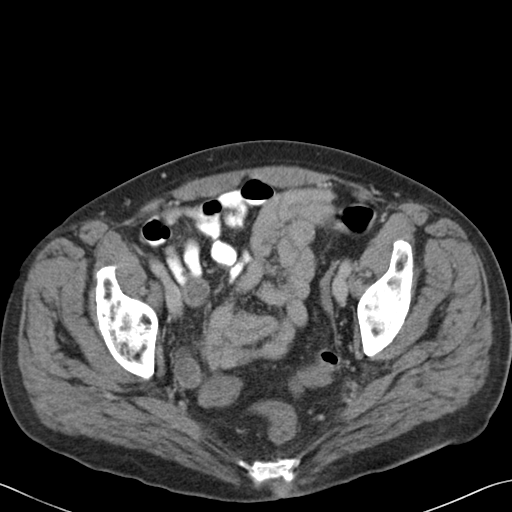
[im 36/91  soft-tissue]
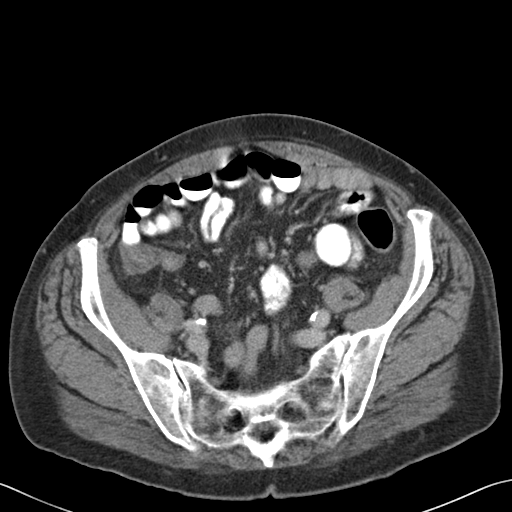
[im 41/91  soft-tissue]
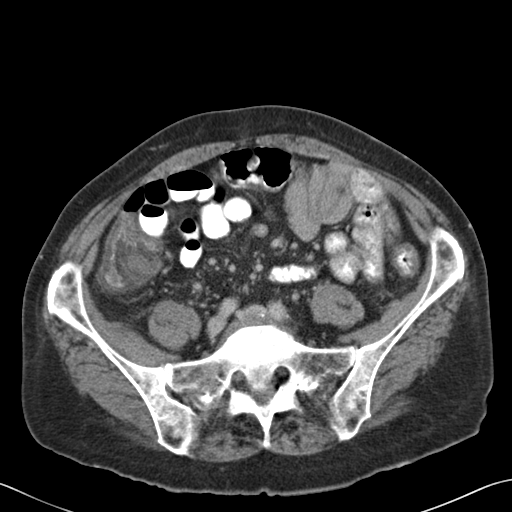
[im 51/91  soft-tissue]
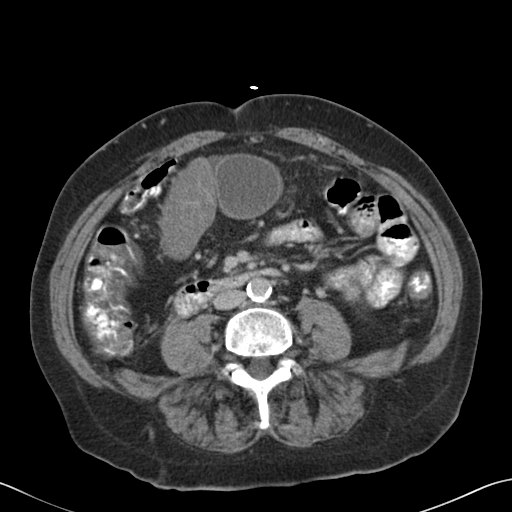
[im 56/91  soft-tissue]
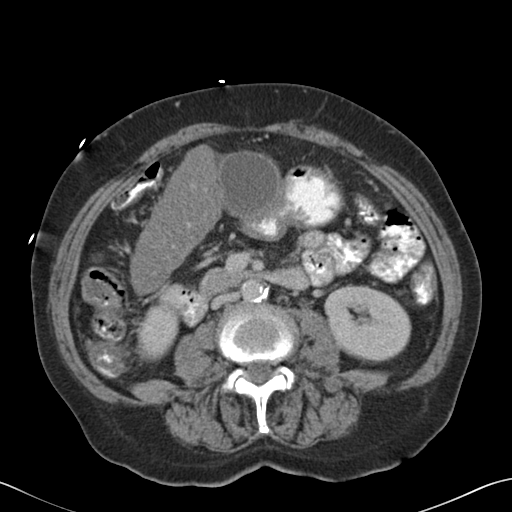
[im 66/91  soft-tissue]
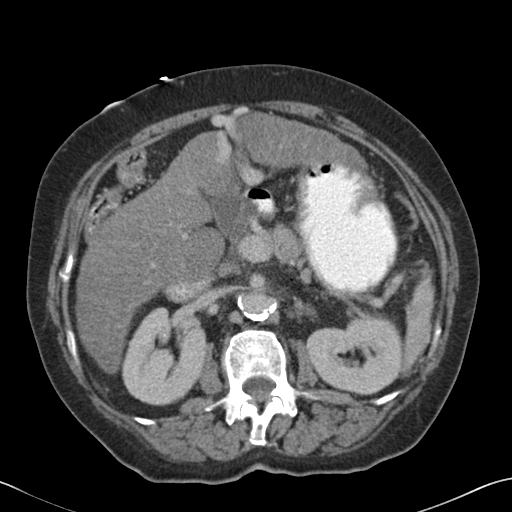
[im 66/91  bone]
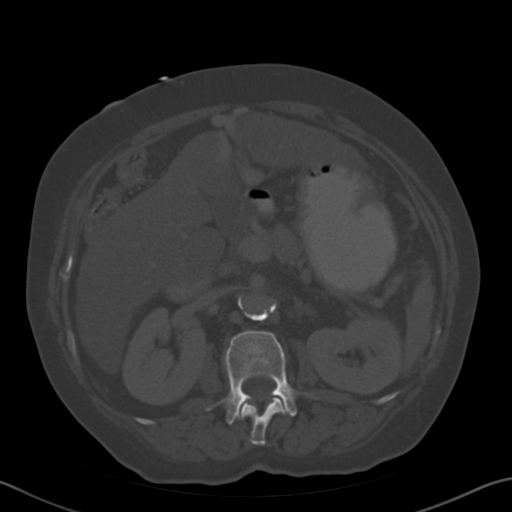
[im 71/91  soft-tissue]
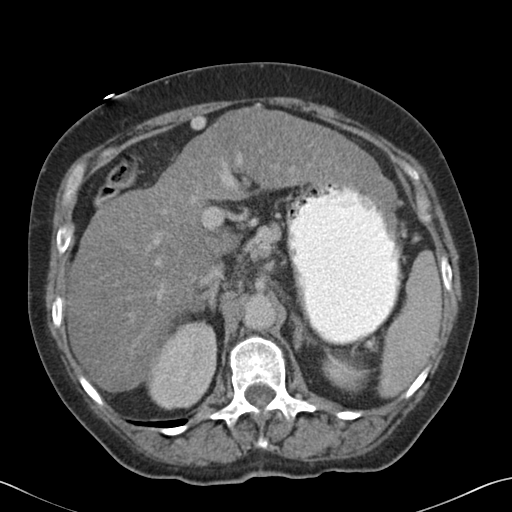
[im 71/91  lung]
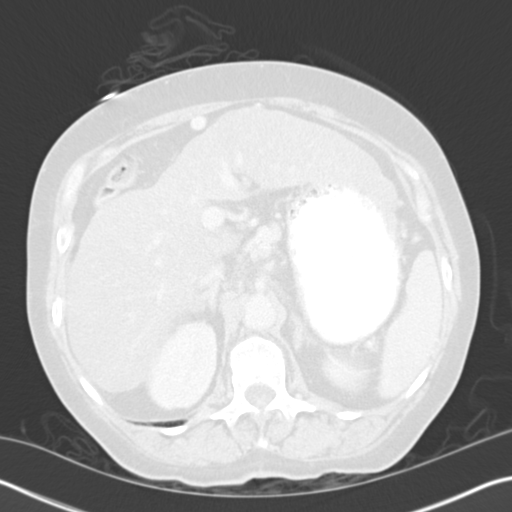
[im 76/91  soft-tissue]
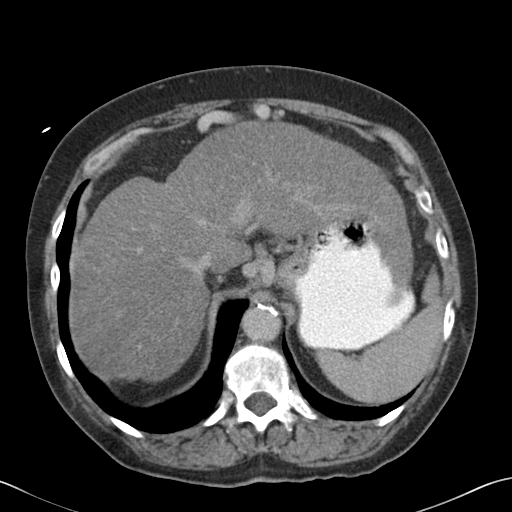
[im 76/91  lung]
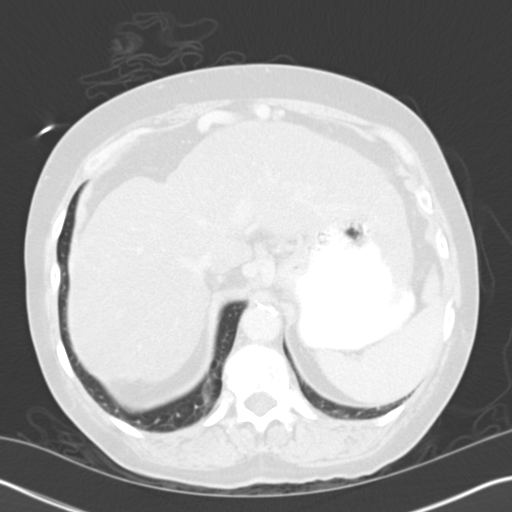
[im 81/91  lung]
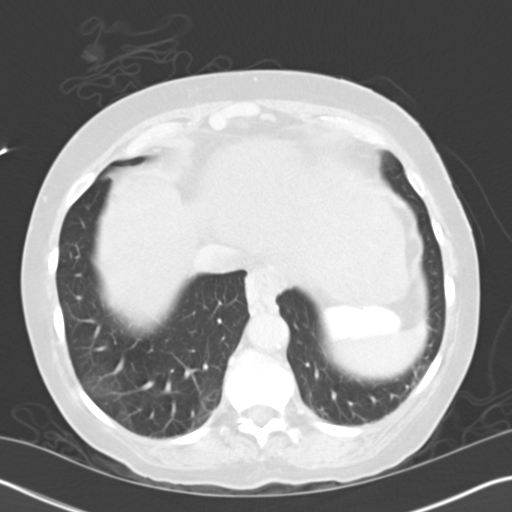
[im 86/91  soft-tissue]
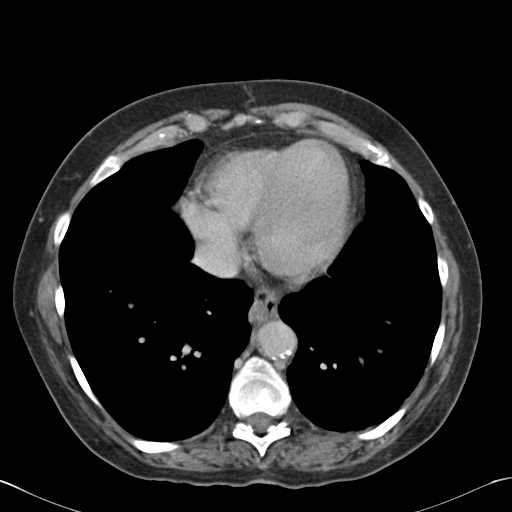
[im 86/91  lung]
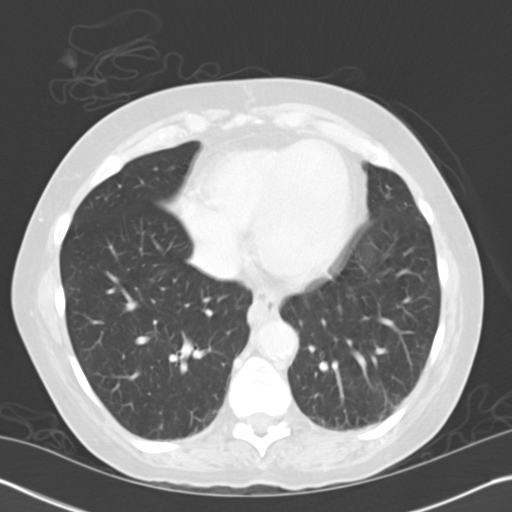

[13 of 32 positions shown; findings below may reference images not displayed]

FINDINGS: Lower chest: Clear lung bases. No significant pleural or pericardial
effusion. The heart is mildly enlarged. There is atherosclerosis of
the aorta and coronary arteries. Distal esophageal varices noted.

Hepatobiliary: The liver demonstrates severe steatosis with contour
irregularity and diffuse micro nodularity consistent with cirrhosis.
No focal enhancing lesions identified. The portal and hepatic veins
are patent. The left paraumbilical vein is recanalized. The
gallbladder is distended without calcified stones, significant wall
thickening or biliary dilatation.

Pancreas: Unremarkable. No pancreatic ductal dilatation or
surrounding inflammatory changes.

Spleen: The spleen is not significantly enlarged. There is a stable
cyst inferiorly within the spleen.

Adrenals/Urinary Tract: Both adrenal glands appear normal.The
kidneys appear normal without evidence of urinary tract calculus,
suspicious lesion or hydronephrosis. No bladder abnormalities are
seen.

Stomach/Bowel: The stomach and small bowel demonstrate no
significant findings. There is prominent wall thickening within the
right colon. No significant surrounding inflammation identified.
Appendix non visualized. The distal colon demonstrates mild
diverticulosis without wall thickening.

Vascular/Lymphatic: Stable mild reactive adenopathy in the porta
hepatis. No retroperitoneal lymphadenopathy. Stable atherosclerosis
of the aorta, its branches and iliac arteries. As above, there is
re- cannulization of the left periumbilical vein and distal
esophageal varices.

Reproductive: Status post hysterectomy.  No adnexal mass.

Other: Mild mesenteric edema without ascites.

Musculoskeletal: No acute or significant osseous findings. Stable
lumbar spine facet degenerative changes.
IMPRESSION: 1. Cirrhosis with increased steatosis, but no apparent focal hepatic
lesion.
2. Portal hypertension manifesting as distal esophageal varices and
re- cannulization of the left paraumbilical vein. No significant
splenomegaly.
3. Right colon wall thickening could be related to the patient's
liver disease, although is suspicious for colitis. No evidence of
bowel obstruction or abscess.
4. Moderate diffuse atherosclerosis.
5. Gallbladder hydrops without wall thickening or biliary
dilatation.
